# Patient Record
Sex: Female | Born: 1985 | Race: Black or African American | Hispanic: No | Marital: Married | Smoking: Never smoker
Health system: Southern US, Community
[De-identification: ages and names within clinical notes are randomized; demographics above are authoritative.]

## PROBLEM LIST (undated history)

## (undated) ENCOUNTER — Inpatient Hospital Stay (HOSPITAL_COMMUNITY): Payer: Self-pay

## (undated) DIAGNOSIS — J45909 Unspecified asthma, uncomplicated: Secondary | ICD-10-CM

## (undated) HISTORY — DX: Unspecified asthma, uncomplicated: J45.909

## (undated) HISTORY — PX: WRIST SURGERY: SHX841

---

## 2015-10-27 ENCOUNTER — Inpatient Hospital Stay (HOSPITAL_COMMUNITY)
Admission: AD | Admit: 2015-10-27 | Discharge: 2015-10-27 | Disposition: A | Discharge status: Home / Self Care | Payer: Self-pay | Source: Ambulatory Visit | Attending: Obstetrics & Gynecology | Admitting: Obstetrics & Gynecology

## 2015-10-27 DIAGNOSIS — Z3202 Encounter for pregnancy test, result negative: Secondary | ICD-10-CM | POA: Insufficient documentation

## 2015-10-27 DIAGNOSIS — Z789 Other specified health status: Secondary | ICD-10-CM

## 2015-10-27 LAB — POCT PREGNANCY, URINE: PREG TEST UR: NEGATIVE

## 2015-10-27 NOTE — MAU Note (Signed)
Trying to get pregnant but is not happening. Period started 2 days ago but that period started late. Wants to know if she is able to have a baby. No pain tonight

## 2015-10-27 NOTE — Discharge Instructions (Signed)
[facility]   Francoise CeoBernard Marshall      Phone: [phone]  [facility]     Phone: [phone]  [facility] [place] [facility]  Phone: [phone]  [facility] [place]  Phone: [phone]  [facility]    Phone: [phone]   [facility]    Phone: [phone]  [facility]    Phone: [phone]  [facility]    Phone: [phone]  [facility]    Phone: [phone]  [facility] Phone: [phone]   [facility]  Phone: [phone]  [facility]    Phone: [phone]  [facility]   Phone: [phone]  [facility]      Phone: [phone]  [facility]    Phone: [phone]  [facility]     Phone: [phone]

## 2015-10-27 NOTE — MAU Note (Signed)
Tyson DenseMelanie Bhrambi CNM in Triage to talk with pt

## 2015-10-27 NOTE — MAU Provider Note (Signed)
S: Desires pregnancy, attempting conception x1 yr, wants to know why she can't get pregnant. No c/o. LMP 2 days ago.  O: GENERAL: Well-developed, well-nourished female in no acute distress.  LUNGS: Effort normal SKIN: Warm, dry and without erythema PSYCH: Normal mood and affect Results for orders placed or performed during the hospital encounter of 10/27/15 (from the past 24 hour(s))  Pregnancy, urine POC     Status: None   Collection Time: 10/27/15  8:23 PM  Result Value Ref Range   Preg Test, Ur NEGATIVE NEGATIVE     A/P: Attempting conception Ob/Gyn Provider list provided Follow up with provider of choice

## 2015-11-11 ENCOUNTER — Emergency Department (HOSPITAL_COMMUNITY)
Admission: EM | Admit: 2015-11-11 | Discharge: 2015-11-11 | Disposition: A | Discharge status: Home / Self Care | Payer: Self-pay | Attending: Emergency Medicine | Admitting: Emergency Medicine

## 2015-11-11 ENCOUNTER — Encounter (HOSPITAL_COMMUNITY): Payer: Self-pay | Admitting: Emergency Medicine

## 2015-11-11 DIAGNOSIS — N72 Inflammatory disease of cervix uteri: Secondary | ICD-10-CM | POA: Insufficient documentation

## 2015-11-11 LAB — URINALYSIS, ROUTINE W REFLEX MICROSCOPIC
Bilirubin Urine: NEGATIVE
GLUCOSE, UA: NEGATIVE mg/dL
HGB URINE DIPSTICK: NEGATIVE
Ketones, ur: 15 mg/dL — AB
Nitrite: NEGATIVE
PH: 7 (ref 5.0–8.0)
PROTEIN: NEGATIVE mg/dL
SPECIFIC GRAVITY, URINE: 1.018 (ref 1.005–1.030)

## 2015-11-11 LAB — WET PREP, GENITAL
CLUE CELLS WET PREP: NONE SEEN
SPERM: NONE SEEN
TRICH WET PREP: NONE SEEN
Yeast Wet Prep HPF POC: NONE SEEN

## 2015-11-11 LAB — URINE MICROSCOPIC-ADD ON: RBC / HPF: NONE SEEN RBC/hpf (ref 0–5)

## 2015-11-11 LAB — PREGNANCY, URINE: Preg Test, Ur: NEGATIVE

## 2015-11-11 MED ORDER — LIDOCAINE HCL (PF) 1 % IJ SOLN
0.9000 mL | Freq: Once | INTRAMUSCULAR | Status: AC
  Administered 2015-11-11: 0.9 mL
  Filled 2015-11-11: qty 5

## 2015-11-11 MED ORDER — AZITHROMYCIN 250 MG PO TABS
1000.0000 mg | ORAL_TABLET | Freq: Once | ORAL | Status: AC
  Administered 2015-11-11: 1000 mg via ORAL
  Filled 2015-11-11: qty 4

## 2015-11-11 MED ORDER — CEFTRIAXONE SODIUM 250 MG IJ SOLR
250.0000 mg | Freq: Once | INTRAMUSCULAR | Status: AC
Start: 2015-11-11 — End: 2015-11-11
  Administered 2015-11-11: 250 mg via INTRAMUSCULAR
  Filled 2015-11-11: qty 250

## 2015-11-11 NOTE — ED Notes (Signed)
Husband/translator stated, vaginal discharge for one month to 3 weeks , pain in lower abdomen when she has sex.

## 2015-11-11 NOTE — Discharge Instructions (Signed)
Return to the ED with any concerns including vomiting, abdominal pain, fever/chills, fainting, decreased level of alertness/lethargy, or any other alarming symptoms

## 2015-11-11 NOTE — ED Provider Notes (Signed)
CSN: [id number]     Arrival date & time 11/11/15  1431 History   None    Chief Complaint  Patient presents with  . Vaginal Discharge     (Consider location/radiation/quality/duration/timing/severity/associated sxs/prior Treatment) HPI  Pt presenting with c/o vaginal discharge and pelvic pain during sex.  She states she has had symptoms for approx 3 weeks.  No vaginal bleeding.  No abdominal pain.  No pelvic pain except during sex.  No fever.  No vomiting.  She has not had any treatment prior to arrival.  LMP was 2 weeks ago.  No dysuria, no increased frequency/urgency.  There are no other associated systemic symptoms, there are no other alleviating or modifying factors.   History reviewed. No pertinent past medical history. History reviewed. No pertinent past surgical history. No family history on file. Social History  Substance Use Topics  . Smoking status: None  . Smokeless tobacco: None  . Alcohol Use: None   OB History    No data available     Review of Systems  ROS reviewed and all otherwise negative except for mentioned in HPI    Allergies  Review of patient's allergies indicates not on file.  Home Medications   Prior to Admission medications   Not on File   BP 108/71 mmHg  Pulse 65  Temp(Src) 97.9 F (36.6 C) (Oral)  Resp 16  SpO2 99%  LMP 10/25/2015  Vitals reviewed Physical Exam  Physical Examination: General appearance - alert, well appearing, and in no distress Mental status - alert, oriented to person, place, and time Eyes - no conjunctival injection no scleral icterus Chest - clear to auscultation, no wheezes, rales or rhonchi, symmetric air entry Heart - normal rate, regular rhythm, normal S1, S2, no murmurs, rubs, clicks or gallops Abdomen - soft, nontender, nondistended, no masses or organomegaly Pelvic - normal external genitalia, vulva, vagina, cervix, uterus and adnexa, no CMT, no adnexa, scant vaginal discharge Neurological - alert, oriented,  normal speech Extremities - peripheral pulses normal, no pedal edema, no clubbing or cyanosis Skin - normal coloration and turgor, no rashes  ED Course  Procedures (including critical care time) Labs Review Labs Reviewed  WET PREP, GENITAL - Abnormal; Notable for the following:    WBC, Wet Prep HPF POC MANY (*)    All other components within normal limits  URINALYSIS, ROUTINE W REFLEX MICROSCOPIC (NOT AT [facility]) - Abnormal; Notable for the following:    Ketones, ur 15 (*)    Leukocytes, UA TRACE (*)    All other components within normal limits  URINE MICROSCOPIC-ADD ON - Abnormal; Notable for the following:    Squamous Epithelial / LPF 6-30 (*)    Bacteria, UA FEW (*)    All other components within normal limits  PREGNANCY, URINE  GC/CHLAMYDIA PROBE AMP ([place]) NOT AT [facility]    Imaging Review No results found. I have personally reviewed and evaluated these images and lab results as part of my medical decision-making.   EKG Interpretation None      MDM   Final diagnoses:  Cervicitis    Pt presenting with c/o vaginal discharge, dyspareunia.  On exam she has no abdominal tenderness, no CMT or signs of PID.  Her wet prep did show many WBCs, pt treated for cervicitis, genprobe is pending.  Given referral info for gyn as well as [facility].  Discharged with strict return precautions.  Pt agreeable with plan.    Jerelyn Scott, MD 11/11/15  2248 

## 2015-11-13 LAB — GC/CHLAMYDIA PROBE AMP (~~LOC~~) NOT AT ARMC
Chlamydia: NEGATIVE
Neisseria Gonorrhea: NEGATIVE

## 2016-03-03 ENCOUNTER — Emergency Department (HOSPITAL_COMMUNITY)
Admission: EM | Admit: 2016-03-03 | Discharge: 2016-03-03 | Disposition: A | Discharge status: Home / Self Care | Payer: Self-pay | Attending: Emergency Medicine | Admitting: Emergency Medicine

## 2016-03-03 ENCOUNTER — Emergency Department (HOSPITAL_COMMUNITY): Payer: Self-pay

## 2016-03-03 ENCOUNTER — Encounter (HOSPITAL_COMMUNITY): Payer: Self-pay

## 2016-03-03 DIAGNOSIS — R102 Pelvic and perineal pain: Secondary | ICD-10-CM | POA: Insufficient documentation

## 2016-03-03 LAB — CBC
HCT: 41.3 % (ref 36.0–46.0)
HEMOGLOBIN: 13.9 g/dL (ref 12.0–15.0)
MCH: 29.2 pg (ref 26.0–34.0)
MCHC: 33.7 g/dL (ref 30.0–36.0)
MCV: 86.8 fL (ref 78.0–100.0)
Platelets: 331 10*3/uL (ref 150–400)
RBC: 4.76 MIL/uL (ref 3.87–5.11)
RDW: 12.8 % (ref 11.5–15.5)
WBC: 6.8 10*3/uL (ref 4.0–10.5)

## 2016-03-03 LAB — COMPREHENSIVE METABOLIC PANEL
ALK PHOS: 69 U/L (ref 38–126)
ALT: 10 U/L — ABNORMAL LOW (ref 14–54)
ANION GAP: 10 (ref 5–15)
AST: 20 U/L (ref 15–41)
Albumin: 4.4 g/dL (ref 3.5–5.0)
BUN: 9 mg/dL (ref 6–20)
CALCIUM: 9.5 mg/dL (ref 8.9–10.3)
CHLORIDE: 108 mmol/L (ref 101–111)
CO2: 19 mmol/L — AB (ref 22–32)
Creatinine, Ser: 0.67 mg/dL (ref 0.44–1.00)
GFR calc non Af Amer: >60 mL/min (ref >60)
Glucose, Bld: 75 mg/dL (ref 65–99)
Potassium: 3.9 mmol/L (ref 3.5–5.1)
SODIUM: 137 mmol/L (ref 135–145)
Total Bilirubin: 0.7 mg/dL (ref 0.3–1.2)
Total Protein: 7.9 g/dL (ref 6.5–8.1)

## 2016-03-03 LAB — LIPASE, BLOOD: LIPASE: 27 U/L (ref 11–51)

## 2016-03-03 LAB — URINALYSIS, ROUTINE W REFLEX MICROSCOPIC
Bilirubin Urine: NEGATIVE
Glucose, UA: NEGATIVE mg/dL
HGB URINE DIPSTICK: NEGATIVE
Ketones, ur: 15 mg/dL — AB
Leukocytes, UA: NEGATIVE
Nitrite: NEGATIVE
Protein, ur: NEGATIVE mg/dL
SPECIFIC GRAVITY, URINE: 1.013 (ref 1.005–1.030)
pH: 5 (ref 5.0–8.0)

## 2016-03-03 LAB — WET PREP, GENITAL
CLUE CELLS WET PREP: NONE SEEN
Sperm: NONE SEEN
TRICH WET PREP: NONE SEEN
YEAST WET PREP: NONE SEEN

## 2016-03-03 LAB — I-STAT BETA HCG BLOOD, ED (MC, WL, AP ONLY)

## 2016-03-03 MED ORDER — IBUPROFEN 600 MG PO TABS: 600.0000 mg | ORAL_TABLET | Freq: Four times a day (QID) | ORAL | 0 refills | Status: DC | PRN

## 2016-03-03 NOTE — ED Provider Notes (Signed)
[facility]PT Provider Note   CSN: [id number] Arrival date & time: 03/03/16  1037     History   Chief Complaint Chief Complaint  Patient presents with  . Abdominal Pain    HPI Sheila [name] is a 30 y.o. female.  HPI   G1P1001 presents with left lower abdominal pain that began more than a month ago.   The pain is constant but waxes and wanes, is exacerbated by walking, palpation, sexual intercourse.  Has occasional lightheadedness.  Small amount of vaginal discharge.  Has regular periods, LMP 2 weeks ago though she is trying to get pregnant.   Denies fevers, chills, N/V, urinary symptoms, bowel changes.  Has BM every 4 days at baseline, unsure of last BM. Denies feeling constipated.    Language interpretation by patient's significant other.   History reviewed. No pertinent past medical history.  There are no active problems to display for this patient.   History reviewed. No pertinent surgical history.  OB History    No data available       Home Medications    Prior to Admission medications   Medication Sig Start Date End Date Taking? Authorizing Provider  Prenatal Vit-Fe Fumarate-FA (PRENATAL MULTIVITAMIN) TABS tablet Take 1 tablet by mouth daily at 12 noon.   Yes Historical Provider, MD  ibuprofen (ADVIL,MOTRIN) 600 MG tablet Take 1 tablet (600 mg total) by mouth every 6 (six) hours as needed for mild pain or moderate pain. 03/03/16   Trixie Dredge, PA-C    Family History No family history on file.  Social History Social History  Substance Use Topics  . Smoking status: Not on file  . Smokeless tobacco: Not on file  . Alcohol use Not on file     Allergies   Patient has no known allergies.   Review of Systems Review of Systems  All other systems reviewed and are negative.    Physical Exam Updated Vital Signs BP 94/69   Pulse 75   Temp 97.5 F (36.4 C) (Oral)   Resp 16   SpO2 99%   Physical Exam  Constitutional: She appears  well-developed and well-nourished. No distress.  HENT:  Head: Normocephalic and atraumatic.  Neck: Neck supple.  Cardiovascular: Normal rate and regular rhythm.   Pulmonary/Chest: Effort normal and breath sounds normal. No respiratory distress. She has no wheezes. She has no rales.  Abdominal: Soft. She exhibits no distension. There is tenderness in the left lower quadrant. There is no rebound and no guarding.    Genitourinary: Uterus is not tender. Cervix exhibits no motion tenderness. Left adnexum displays tenderness.  Neurological: She is alert.  Skin: She is not diaphoretic.  Nursing note and vitals reviewed.    ED Treatments / Results  Labs (all labs ordered are listed, but only abnormal results are displayed) Labs Reviewed  WET PREP, GENITAL - Abnormal; Notable for the following:       Result Value   WBC, Wet Prep HPF POC FEW (*)    All other components within normal limits  COMPREHENSIVE METABOLIC PANEL - Abnormal; Notable for the following:    CO2 19 (*)    ALT 10 (*)    All other components within normal limits  URINALYSIS, ROUTINE W REFLEX MICROSCOPIC (NOT AT [facility]) - Abnormal; Notable for the following:    Ketones, ur 15 (*)    All other components within normal limits  LIPASE, BLOOD  CBC  RPR  HIV ANTIBODY (ROUTINE TESTING)  I-STAT BETA HCG BLOOD,  ED ([facility], [facility], [facility] ONLY)  GC/CHLAMYDIA PROBE AMP ([place]) NOT AT [facility]    EKG  EKG Interpretation None       Radiology [place] Transvaginal Non-ob  Result Date: 03/03/2016 CLINICAL DATA:  Pelvic pain on the left for 5 months. EXAM: TRANSABDOMINAL AND TRANSVAGINAL ULTRASOUND OF PELVIS DOPPLER ULTRASOUND OF OVARIES TECHNIQUE: Both transabdominal and transvaginal ultrasound examinations of the pelvis were performed. Transabdominal technique was performed for global imaging of the pelvis including uterus, ovaries, adnexal regions, and pelvic cul-de-sac. It was necessary to proceed with endovaginal exam following the  transabdominal exam to visualize the endometrium and ovaries. Color and duplex Doppler ultrasound was utilized to evaluate blood flow to the ovaries. COMPARISON:  None. FINDINGS: Uterus Measurements: 7.4 x 3.5 x 5.1 cm. No fibroids or other mass visualized. Endometrium Thickness: 9 mm.  No focal abnormality visualized. Right ovary Measurements: 3.8 x 2.3 x 2.3 cm. Probable hemorrhagic cyst in the right ovary measuring 2.1 cm. A 6 week follow-up ultrasound could ensure resolution. Left ovary Measurements: 2.3 x 1.5 x 2.1 cm. Normal appearance/no adnexal mass. Pulsed Doppler evaluation of both ovaries demonstrates normal low-resistance arterial and venous waveforms. Other findings A small amount of physiologic fluid is seen in the pelvis. IMPRESSION: 1. No cause for left-sided pelvic pain. 2. Rounded complex region in the right ovary, likely a hemorrhagic cyst. A 6 week follow-up could ensure resolution. Electronically Signed   By: Gerome Samavid  Williams III M.D   On: 03/03/2016 13:06   [place] Pelvis Complete  Result Date: 03/03/2016 CLINICAL DATA:  Pelvic pain on the left for 5 months. EXAM: TRANSABDOMINAL AND TRANSVAGINAL ULTRASOUND OF PELVIS DOPPLER ULTRASOUND OF OVARIES TECHNIQUE: Both transabdominal and transvaginal ultrasound examinations of the pelvis were performed. Transabdominal technique was performed for global imaging of the pelvis including uterus, ovaries, adnexal regions, and pelvic cul-de-sac. It was necessary to proceed with endovaginal exam following the transabdominal exam to visualize the endometrium and ovaries. Color and duplex Doppler ultrasound was utilized to evaluate blood flow to the ovaries. COMPARISON:  None. FINDINGS: Uterus Measurements: 7.4 x 3.5 x 5.1 cm. No fibroids or other mass visualized. Endometrium Thickness: 9 mm.  No focal abnormality visualized. Right ovary Measurements: 3.8 x 2.3 x 2.3 cm. Probable hemorrhagic cyst in the right ovary measuring 2.1 cm. A 6 week follow-up ultrasound  could ensure resolution. Left ovary Measurements: 2.3 x 1.5 x 2.1 cm. Normal appearance/no adnexal mass. Pulsed Doppler evaluation of both ovaries demonstrates normal low-resistance arterial and venous waveforms. Other findings A small amount of physiologic fluid is seen in the pelvis. IMPRESSION: 1. No cause for left-sided pelvic pain. 2. Rounded complex region in the right ovary, likely a hemorrhagic cyst. A 6 week follow-up could ensure resolution. Electronically Signed   By: Gerome Samavid  Williams III M.D   On: 03/03/2016 13:06   [place] Art/ven Flow Abd Pelv Doppler  Result Date: 03/03/2016 CLINICAL DATA:  Pelvic pain on the left for 5 months. EXAM: TRANSABDOMINAL AND TRANSVAGINAL ULTRASOUND OF PELVIS DOPPLER ULTRASOUND OF OVARIES TECHNIQUE: Both transabdominal and transvaginal ultrasound examinations of the pelvis were performed. Transabdominal technique was performed for global imaging of the pelvis including uterus, ovaries, adnexal regions, and pelvic cul-de-sac. It was necessary to proceed with endovaginal exam following the transabdominal exam to visualize the endometrium and ovaries. Color and duplex Doppler ultrasound was utilized to evaluate blood flow to the ovaries. COMPARISON:  None. FINDINGS: Uterus Measurements: 7.4 x 3.5 x 5.1 cm. No fibroids or other mass visualized. Endometrium  Thickness: 9 mm.  No focal abnormality visualized. Right ovary Measurements: 3.8 x 2.3 x 2.3 cm. Probable hemorrhagic cyst in the right ovary measuring 2.1 cm. A 6 week follow-up ultrasound could ensure resolution. Left ovary Measurements: 2.3 x 1.5 x 2.1 cm. Normal appearance/no adnexal mass. Pulsed Doppler evaluation of both ovaries demonstrates normal low-resistance arterial and venous waveforms. Other findings A small amount of physiologic fluid is seen in the pelvis. IMPRESSION: 1. No cause for left-sided pelvic pain. 2. Rounded complex region in the right ovary, likely a hemorrhagic cyst. A 6 week follow-up could ensure  resolution. Electronically Signed   By: Gerome Samavid  Williams III M.D   On: 03/03/2016 13:06   Ct Renal Stone Study  Result Date: 03/03/2016 CLINICAL DATA:  Left lower quadrant pain for 5 days. EXAM: CT ABDOMEN AND PELVIS WITHOUT CONTRAST TECHNIQUE: Multidetector CT imaging of the abdomen and pelvis was performed following the standard protocol without IV contrast. COMPARISON:  None. FINDINGS: Lower chest:  Unremarkable. Hepatobiliary: No focal abnormality in the liver on this study without intravenous contrast. There is no evidence for gallstones, gallbladder wall thickening, or pericholecystic fluid. No intrahepatic or extrahepatic biliary dilation. Pancreas: No focal mass lesion. No dilatation of the main duct. No intraparenchymal cyst. No peripancreatic edema. Spleen: No splenomegaly. No focal mass lesion. Adrenals/Urinary Tract: No adrenal nodule or mass. Kidneys are unremarkable with no evidence for nephrolithiasis. No evidence for hydroureter. The urinary bladder appears normal for the degree of distention. Stomach/Bowel: Stomach is nondistended. No gastric wall thickening. No evidence of outlet obstruction. Duodenum is normally positioned as is the ligament of Treitz. The terminal ileum is normal. The appendix is normal. No gross colonic mass. No colonic wall thickening. No substantial diverticular change. Vascular/Lymphatic: No abdominal aortic aneurysm. No abdominal aortic atherosclerotic calcification. There is no gastrohepatic or hepatoduodenal ligament lymphadenopathy. No intraperitoneal or retroperitoneal lymphadenopathy. No pelvic sidewall lymphadenopathy. Reproductive: The uterus has normal CT imaging appearance. There is no adnexal mass. Other: Trace intraperitoneal free fluid noted in the cul-de-sac. Musculoskeletal: Bone windows reveal no worrisome lytic or sclerotic osseous lesions. IMPRESSION: 1. No acute findings in the abdomen or pelvis. Specifically, no findings to explain the patient's  history of left lower quadrant pain. 2. Trace free fluid in the cul-de-sac. This can be physiologic in a reproductive age female. Electronically Signed   By: Kennith CenterEric  Mansell M.D.   On: 03/03/2016 16:03    Procedures Procedures (including critical care time)  Medications Ordered in ED Medications - No data to display   Initial Impression / Assessment and Plan / ED Course  I have reviewed the triage vital signs and the nursing notes.  Pertinent labs & imaging results that were available during my care of the patient were reviewed by me and considered in my medical decision making (see chart for details).  Clinical Course     Afebrile nontoxic patient with left pelvic pain x >1 month.  Labs unremarkable.  UA does not appear infected.  Pelvic exam with left adnexal tenderness.  Wet prep unremarkable.   US pelvis with right hemorrhagic cyst but nothing on left.  CT without acute findings.  Unclear why pt has this pain.  Referred to gynecology.  D/C home with motrin.  Discussed result, findings, treatment, and follow up  with patient.  Pt given return precautions.  Pt verbalizes understanding and agrees with plan.       Final Clinical Impressions(s) / ED Diagnoses   Final diagnoses:  Pelvic pain  Pelvic  pain    New Prescriptions Discharge Medication List as of 03/03/2016  4:29 PM    START taking these medications   Details  ibuprofen (ADVIL,MOTRIN) 600 MG tablet Take 1 tablet (600 mg total) by mouth every 6 (six) hours as needed for mild pain or moderate pain., Starting Sun 03/03/2016, Print         [place], PA-C 03/03/16 1711    Nelva Nay, MD 03/20/16 [phone]

## 2016-03-03 NOTE — ED Triage Notes (Signed)
Patient complains of left sided abdominal pain for greater than 1 month. States hurts almost daily and worse with ambulation. No nausea, no vomiting, no diarrhea. Alert and oriented, NAD

## 2016-03-03 NOTE — Discharge Instructions (Signed)
Read the information below.  You may return to the Emergency Department at any time for worsening condition or any new symptoms that concern you.  If you develop high fevers, worsening abdominal pain, uncontrolled vomiting, or are unable to tolerate fluids by mouth, return to the ER for a recheck.   °

## 2016-03-04 LAB — RPR: RPR Ser Ql: NONREACTIVE

## 2016-03-04 LAB — GC/CHLAMYDIA PROBE AMP (~~LOC~~) NOT AT ARMC
CHLAMYDIA, DNA PROBE: NEGATIVE
Neisseria Gonorrhea: NEGATIVE

## 2016-03-04 LAB — HIV ANTIBODY (ROUTINE TESTING W REFLEX): HIV Screen 4th Generation wRfx: NONREACTIVE

## 2016-03-25 ENCOUNTER — Ambulatory Visit (INDEPENDENT_AMBULATORY_CARE_PROVIDER_SITE_OTHER): Payer: Self-pay | Admitting: Obstetrics & Gynecology

## 2016-03-25 ENCOUNTER — Ambulatory Visit (INDEPENDENT_AMBULATORY_CARE_PROVIDER_SITE_OTHER): Payer: Self-pay | Admitting: Clinical

## 2016-03-25 ENCOUNTER — Encounter: Payer: Self-pay | Admitting: Obstetrics & Gynecology

## 2016-03-25 DIAGNOSIS — R109 Unspecified abdominal pain: Secondary | ICD-10-CM

## 2016-03-25 DIAGNOSIS — R102 Pelvic and perineal pain: Secondary | ICD-10-CM | POA: Insufficient documentation

## 2016-03-25 DIAGNOSIS — F4322 Adjustment disorder with anxiety: Secondary | ICD-10-CM

## 2016-03-25 MED ORDER — POLYETHYLENE GLYCOL 3350 17 G PO PACK: 17.0000 g | PACK | Freq: Every day | ORAL | 1 refills | Status: DC

## 2016-03-25 NOTE — Progress Notes (Signed)
Used [employer] video interpreter Nadege [id number]. States ibuprofen help pain, when it wears off pain =9.

## 2016-03-25 NOTE — BH Specialist Note (Signed)
Session Start time: 10:30   End Time: 10:50 Total Time:  20 minutes Type of Service: Behavioral Health - Individual/Family Interpreter: Yes.     Interpreter Name & Language: Janice NorrieFrench # [facility]-June [facility]   SUBJECTIVE: Sheila [name] is a 30 y.o. female  Pt. was referred by Dr Debroah LoopArnold for:  anxiety. Pt. reports the following symptoms/concerns: Pt states that she has been feeling anxious the past month, as she is afraid that she might have cancer, although she has not had a cancer diagnosis; copes by spending time with friends to get her mind off of her worries.Pt says that anxiety is not a problem, and does not affect daily functioning. Duration of problem:  One month Severity: moderate Previous treatment: none   OBJECTIVE: Mood: Appropriate & Affect: Appropriate Risk of harm to self or others: No known risk of harm to self or others Assessments administered: PHQ9: 2/ GAD7: 13  LIFE CONTEXT:  Family & Social: Friends and family close and supportive  School/ Work: Uncertain Self-Care: No issues with self-care Life changes: Undiagnosed abdominal pain What is important to pt/family (values): Unknown   GOALS ADDRESSED:  -Alleviate symptoms of anxiety  INTERVENTIONS: Strength-based and Supportive   ASSESSMENT:  Pt currently experiencing Adjustment disorder with anxious mood.  Pt may benefit from brief therapeutic intervention regarding coping with symptoms of anxiety.    PLAN: 1. F/U with behavioral health clinician: As needed 2. Behavioral Health meds: none 3. Behavioral recommendations: -Continue to spend routine time with friends for emotional support -[facility], if symptoms increase and/or do not improve 4. Referral: Brief Counseling/Psychotherapy  Rae LipsJamie C Mcmannes LCSWA Behavioral Health Clinician  Marlon PelWarmhandoff:   Warm Hand Off Completed.        Depression screen PHQ 2/9 03/25/2016  Decreased Interest 0  Down, Depressed,  Hopeless 0  PHQ - 2 Score 0  Altered sleeping 0  Tired, decreased energy 2  Change in appetite 0  Feeling bad or failure about yourself  0  Trouble concentrating 0  Moving slowly or fidgety/restless 0  Suicidal thoughts 0  PHQ-9 Score 2   GAD 7 : Generalized Anxiety Score 03/25/2016  Nervous, Anxious, on Edge 2  Control/stop worrying 3  Worry too much - different things 3  Trouble relaxing 2  Restless 0  Easily annoyed or irritable 0  Afraid - awful might happen 3  Total GAD 7 Score 13
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
## 2016-04-22 NOTE — L&D Delivery Note (Signed)
Patient is 31 y.o. G2P1001 2420w1d admitted for IOL 2/2 postdates. S/p IOL with foley bulb, cytotec, followed by Pitocin.  Delivery Note At 2:49 AM a viable female was delivered via Vaginal, Spontaneous Delivery (Presentation:cephalic ;LOA  ).  APGAR:8 ,9 ; weight  pending.   Placenta status: intact ,spontaneous .  Cord: 3 vessel   Anesthesia:  epidural Episiotomy: None Lacerations:  none Est. Blood Loss (mL): 250  Mom to postpartum.  Baby to Couplet care / Skin to Skin.    Upon arrival, patient was complete. She pushed with good maternal effort to deliver a viable female infant in cephalic, LOA position over intact perineum. No nuchal cord present. Anterior shoulder delivered easily. Baby was noted to have good tone and place on maternal abdomen for oral suctioning, drying and stimulation. Delayed cord clamping performed. Placenta delivered spontaneously with gentle cord traction. Fundus firm with massage and Pitocin. Perineum inspected and found to have no laceration. Counts of sharps, instruments, and lap pads were all correct.   Rolm BookbinderAmber Tahirih Lair, DO [facility]

## 2016-04-25 ENCOUNTER — Ambulatory Visit (INDEPENDENT_AMBULATORY_CARE_PROVIDER_SITE_OTHER): Payer: Self-pay | Admitting: Family Medicine

## 2016-04-25 ENCOUNTER — Encounter: Payer: Self-pay | Admitting: Family Medicine

## 2016-04-25 VITALS — BP 104/66 | HR 82 | Ht 66.0 in | Wt 144.4 lb

## 2016-04-25 DIAGNOSIS — R102 Pelvic and perineal pain: Secondary | ICD-10-CM

## 2016-04-25 NOTE — Assessment & Plan Note (Signed)
The patient has s/sx's of possible endometriosis due to dysmenorrhea and dyspareunia. Diagnosis could not be made without surgery. Patient without insurance which makes this elective surgery less desirable. Offered trial of OC's. Will repeat her u/s to ensure her previously noted hemorrhagic cyst is resolved.

## 2016-04-25 NOTE — Patient Instructions (Signed)
Douleur pelvienne, Femme La douleur pelvienne est ressentie sous le nombril et [place] Tool Worksentre les hanches. Cela peut tre caus par beaucoup de choses diffrentes. Il est important d'obtenir de l'aide immdiatement. Cela est particulirement vrai pour les douleurs svres, aigus ou inhabituelles qui surviennent soudainement. SOINS  DOMICILE Prescilla Soursrenez seulement des mdicaments comme indiqu par Actorvotre docteur. Reposez-vous comme indiqu par Exelon Corporationvotre docteur. Mangez une alimentation saine, comme les fruits, les lgumes et les The PNC Financialviandes maigres. Buvez suffisamment de liquides pour garder Newell Rubbermaidvotre pipi (urine) clair ou jaune ple, ou comme indiqu. vitez le sexe (rapports sexuels) s'il provoque la douleur. Appliquez des compresses chaudes ou froides sur votre bas-ventre (abdomen). Utilisez le type de pack Comptrollerqui aide la douleur. vitez les situations qui vous causent du stress. Arcola Janskyenez un journal pour Abbott Laboratoriessuivre votre douleur. crire: Smithfield FoodsQuand la douleur a commenc. O se trouve-t-il? S'il y a des [address]choses qui semblent tre lies  la douleur, comme la nourriture ou vos rgles. Suivi avec votre mdecin comme dit. OBTENIR DE L'AIDE IMMDIATEMENT SI: Vous avez des saignements Sempra Energyabondants du vagin. Vous avez plus de douleur pelvienne. Vous vous sentez tourdi ou vanoui (vanoui). Vous avez des frissons. Vous avez mal quand vous pissez ou Goodrich Corporationavez du sang dans votre urine. Vous ne pouvez pas arrter d'avoir merde merde (diarrhe). Vous ne pouvez pas arrter de vomir. Vous avez de la fivre ou des symptmes durables depuis plus de 3 jours. Vous avez de la fivre et vos symptmes s'aggravent soudainement. Vous tes abus physiquement ou sexuellement. Votre mdicament n'aide Therapist, occupationalpas votre douleur. Vous avez du liquide (dcharge) venant de votre vagin qui n'est pas normal. ASSUREZ-VOUS: Comprenez ces instructions. Je surveillerai votre tat. Vous obtiendrez de l'aide si vous ne vous portez pas bien ou si vous allez mal. Cette  information n'est pas destine  remplacer les conseils donns par votre fournisseur de soins de sant. Assurez-vous de Lear Corporationdiscuter des questions que vous Optometristavez avec votre fournisseur de soins de sant. Document Diffusion: 09/25/2007 Document rvis: 04/29/2014 Document rvis: 01/27/2015 Elsevier Interactive Patient Education  2017 [employer]vier [employer]

## 2016-04-25 NOTE — Progress Notes (Signed)
   Subjective:    Patient ID: Sheila [name] is a 31 y.o. female presenting with Follow-up  on 04/25/2016  HPI: VideoFrench interpreter: Boykin ReaperRachelle [phone] used Several month h/o LLQ pain. Seen in ED with negative UA and negative wet prep, neg. U/s and neg CT. Pain began with intercourse. Pain is there all the time, if she pushes. She does not feel it with movement. Always painful with intercourse. Always had painful regular cycles. She is G1P1001 First pregnancy at 31 yo. Trying to get pregnant now and is not getting pregnant. Trying x 2 years without results.    Review of Systems  Constitutional: Negative for chills and fever.  Respiratory: Negative for shortness of breath.   Cardiovascular: Negative for chest pain.  Gastrointestinal: Negative for abdominal pain, nausea and vomiting.  Genitourinary: Negative for dysuria.  Skin: Negative for rash.      Objective:    BP 104/66   Pulse 82   Ht 5\' 6"  (1.676 m)   Wt 144 lb 6.4 oz (65.5 kg)   LMP 04/08/2016 (Exact Date)   BMI 23.31 kg/m  Physical Exam  Constitutional: She is oriented to person, place, and time. She appears well-developed and well-nourished. No distress.  HENT:  Head: Normocephalic and atraumatic.  Eyes: No scleral icterus.  Neck: Neck supple.  Cardiovascular: Normal rate.   Pulmonary/Chest: Effort normal.  Abdominal: Soft.    Neurological: She is alert and oriented to person, place, and time.  Skin: Skin is warm and dry.  Psychiatric: She has a normal mood and affect.        Assessment & Plan:   Problem List Items Addressed This Visit      Unprioritized   Pelvic pain - Primary    The patient has s/sx's of possible endometriosis due to dysmenorrhea and dyspareunia. Diagnosis could not be made without surgery. Patient without insurance which makes this elective surgery less desirable. Offered trial of OC's. Will repeat her u/s to ensure her previously noted hemorrhagic cyst is resolved.       Relevant Orders   US Pelvis Complete   US Transvaginal Non-OB       Total face-to-face time with patient: 25 minutes. Over 50% of encounter was spent on counseling and coordination of care. Return in about 3 months (around 07/24/2016).  Reva Boresanya S Tavaughn Silguero 04/25/2016 1:17 PM

## 2016-04-25 NOTE — Progress Notes (Signed)
Pt reports that she is still having pain, was told to followup if she was still having problems.

## 2016-04-26 NOTE — Progress Notes (Signed)
This encounter was created in error - please disregard.

## 2016-05-02 ENCOUNTER — Ambulatory Visit (HOSPITAL_COMMUNITY)
Admission: RE | Admit: 2016-05-02 | Discharge: 2016-05-02 | Disposition: A | Discharge status: Home / Self Care | Payer: Self-pay | Source: Ambulatory Visit | Attending: Family Medicine | Admitting: Family Medicine

## 2016-05-02 DIAGNOSIS — R102 Pelvic and perineal pain: Secondary | ICD-10-CM | POA: Insufficient documentation

## 2016-05-02 DIAGNOSIS — R938 Abnormal findings on diagnostic imaging of other specified body structures: Secondary | ICD-10-CM | POA: Insufficient documentation

## 2016-05-06 ENCOUNTER — Telehealth: Payer: Self-pay | Admitting: General Practice

## 2016-05-06 NOTE — Telephone Encounter (Signed)
Per Dr Shawnie PonsPratt, patient's ultrasound is normal. Called patient, no answer- left message stating we are trying to reach you regarding non urgent results, please call us back. Will send letter

## 2016-06-06 ENCOUNTER — Ambulatory Visit: Payer: Self-pay

## 2016-06-06 ENCOUNTER — Encounter: Payer: Self-pay | Admitting: Family Medicine

## 2016-06-06 DIAGNOSIS — Z3201 Encounter for pregnancy test, result positive: Secondary | ICD-10-CM

## 2016-06-06 LAB — POCT PREGNANCY, URINE: Preg Test, Ur: POSITIVE — AB

## 2016-06-18 NOTE — Progress Notes (Signed)
Patient presented to office for a pregnancy test. Test confirms she is pregnant at this time. Patient will follow up with her ob of choice. Medication were reviewed at this time.

## 2016-07-22 ENCOUNTER — Ambulatory Visit (INDEPENDENT_AMBULATORY_CARE_PROVIDER_SITE_OTHER): Payer: Self-pay | Admitting: Family Medicine

## 2016-07-22 ENCOUNTER — Encounter: Payer: Self-pay | Admitting: Family Medicine

## 2016-07-22 VITALS — BP 91/75 | HR 77 | Wt 139.3 lb

## 2016-07-22 DIAGNOSIS — Z1151 Encounter for screening for human papillomavirus (HPV): Secondary | ICD-10-CM

## 2016-07-22 DIAGNOSIS — O219 Vomiting of pregnancy, unspecified: Secondary | ICD-10-CM

## 2016-07-22 DIAGNOSIS — Z3481 Encounter for supervision of other normal pregnancy, first trimester: Secondary | ICD-10-CM

## 2016-07-22 DIAGNOSIS — Z3491 Encounter for supervision of normal pregnancy, unspecified, first trimester: Secondary | ICD-10-CM

## 2016-07-22 DIAGNOSIS — Z3490 Encounter for supervision of normal pregnancy, unspecified, unspecified trimester: Secondary | ICD-10-CM | POA: Insufficient documentation

## 2016-07-22 DIAGNOSIS — Z113 Encounter for screening for infections with a predominantly sexual mode of transmission: Secondary | ICD-10-CM

## 2016-07-22 DIAGNOSIS — Z124 Encounter for screening for malignant neoplasm of cervix: Secondary | ICD-10-CM

## 2016-07-22 LAB — POCT URINALYSIS DIP (DEVICE)
BILIRUBIN URINE: NEGATIVE
Glucose, UA: NEGATIVE mg/dL
HGB URINE DIPSTICK: NEGATIVE
KETONES UR: NEGATIVE mg/dL
LEUKOCYTES UA: NEGATIVE
Nitrite: NEGATIVE
Protein, ur: NEGATIVE mg/dL
SPECIFIC GRAVITY, URINE: 1.02 (ref 1.005–1.030)
Urobilinogen, UA: 2 mg/dL — ABNORMAL HIGH (ref 0.0–1.0)
pH: 7 (ref 5.0–8.0)

## 2016-07-22 MED ORDER — PROCHLORPERAZINE MALEATE 10 MG PO TABS: 10.0000 mg | ORAL_TABLET | Freq: Four times a day (QID) | ORAL | 3 refills | Status: DC | PRN
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
## 2016-07-22 NOTE — Progress Notes (Signed)
Patient reports a pain in her leg/hip making it difficult to move in the mornings and get out of bed

## 2016-07-24 LAB — OBSTETRIC PANEL, INCLUDING HIV
ANTIBODY SCREEN: NEGATIVE
BASOS: 1 %
Basophils Absolute: 0 10*3/uL (ref 0.0–0.2)
EOS (ABSOLUTE): 0.1 10*3/uL (ref 0.0–0.4)
Eos: 2 %
HEMATOCRIT: 37.8 % (ref 34.0–46.6)
HIV SCREEN 4TH GENERATION: NONREACTIVE
Hemoglobin: 12.7 g/dL (ref 11.1–15.9)
Hepatitis B Surface Ag: NEGATIVE
Immature Grans (Abs): 0 10*3/uL (ref 0.0–0.1)
Immature Granulocytes: 0 %
LYMPHS ABS: 2.1 10*3/uL (ref 0.7–3.1)
Lymphs: 38 %
MCH: 29.3 pg (ref 26.6–33.0)
MCHC: 33.6 g/dL (ref 31.5–35.7)
MCV: 87 fL (ref 79–97)
MONOS ABS: 0.3 10*3/uL (ref 0.1–0.9)
Monocytes: 5 %
NEUTROS ABS: 3 10*3/uL (ref 1.4–7.0)
Neutrophils: 54 %
Platelets: 301 10*3/uL (ref 150–379)
RBC: 4.33 x10E6/uL (ref 3.77–5.28)
RDW: 14.3 % (ref 12.3–15.4)
RPR Ser Ql: NONREACTIVE
Rh Factor: POSITIVE
Rubella Antibodies, IGG: 8.13 index (ref >0.99)
WBC: 5.6 10*3/uL (ref 3.4–10.8)

## 2016-07-24 LAB — HEMOGLOBINOPATHY EVALUATION
Ferritin: 119 ng/mL (ref 15–150)
HGB S: 0 %
HGB SOLUBILITY: NEGATIVE
HGB VARIANT: 0 %
Hgb A2 Quant: 2.5 % (ref 1.8–3.2)
Hgb A: 97.5 % (ref 96.4–98.8)
Hgb C: 0 %
Hgb F Quant: 0 % (ref 0.0–2.0)

## 2016-07-24 LAB — CULTURE, OB URINE

## 2016-07-24 LAB — URINE CULTURE, OB REFLEX

## 2016-07-24 LAB — TSH: TSH: 1.32 u[IU]/mL (ref 0.450–4.500)

## 2016-07-25 LAB — CYTOLOGY - PAP
ADEQUACY: ABSENT — AB
Diagnosis: UNDETERMINED — AB
HPV: NOT DETECTED

## 2016-07-27 ENCOUNTER — Encounter: Payer: Self-pay | Admitting: Family Medicine

## 2016-07-27 DIAGNOSIS — R87610 Atypical squamous cells of undetermined significance on cytologic smear of cervix (ASC-US): Secondary | ICD-10-CM

## 2016-07-27 HISTORY — DX: Atypical squamous cells of undetermined significance on cytologic smear of cervix (ASC-US): R87.610

## 2016-08-16 ENCOUNTER — Telehealth: Payer: Self-pay | Admitting: General Practice

## 2016-08-16 ENCOUNTER — Encounter: Payer: Self-pay | Admitting: Obstetrics & Gynecology

## 2016-08-20 ENCOUNTER — Ambulatory Visit (INDEPENDENT_AMBULATORY_CARE_PROVIDER_SITE_OTHER): Payer: Self-pay | Admitting: Obstetrics and Gynecology

## 2016-08-20 VITALS — BP 115/59 | HR 81 | Wt 143.2 lb

## 2016-08-20 DIAGNOSIS — Z3491 Encounter for supervision of normal pregnancy, unspecified, first trimester: Secondary | ICD-10-CM

## 2016-08-20 DIAGNOSIS — Z113 Encounter for screening for infections with a predominantly sexual mode of transmission: Secondary | ICD-10-CM

## 2016-08-20 NOTE — Progress Notes (Signed)
Patient declines video interpreter.

## 2016-08-20 NOTE — Patient Instructions (Signed)

## 2016-08-21 LAB — GC/CHLAMYDIA PROBE AMP (~~LOC~~) NOT AT ARMC
CHLAMYDIA, DNA PROBE: NEGATIVE
Neisseria Gonorrhea: NEGATIVE

## 2016-08-22 NOTE — Progress Notes (Signed)
   PRENATAL VISIT NOTE  Subjective:  Hurshel PartyFatouma Mounkaila is a 31 y.o. G2P1001 at 173w3d being seen today for ongoing prenatal care.  She is currently monitored for the following issues for this low-risk pregnancy and has Pelvic pain; Supervision of low-risk pregnancy; and Atypical squamous cell changes of undetermined significance (ASCUS) on cervical cytology with negative high risk human papilloma virus (HPV) test result on her problem list.  Patient reports no complaints.  Contractions: Not present. Vag. Bleeding: None.  Movement: Absent. Denies leaking of fluid.   The following portions of the patient's history were reviewed and updated as appropriate: allergies, current medications, past family history, past medical history, past social history, past surgical history and problem list. Problem list updated.  Objective:   Vitals:   08/20/16 1608  BP: (!) 115/59  Pulse: 81  Weight: 143 lb 3.2 oz (65 kg)    Fetal Status: Fetal Heart Rate (bpm): 156   Movement: Absent     General:  Alert, oriented and cooperative. Patient is in no acute distress.  Skin: Skin is warm and dry. No rash noted.   Cardiovascular: Normal heart rate noted  Respiratory: Normal respiratory effort, no problems with respiration noted  Abdomen: Soft, gravid, appropriate for gestational age. Pain/Pressure: Absent     Pelvic:  Cervical exam deferred        Extremities: Normal range of motion.  Edema: None  Mental Status: Normal mood and affect. Normal behavior. Normal judgment and thought content.   Assessment and Plan:  Pregnancy: G2P1001 at 783w3d  1. Encounter for supervision of low-risk pregnancy in first trimester Up to date on screening except GC/CT - GC[facility]Chlamydia probe amp ()[facility]  Preterm labor symptoms and general obstetric precautions including but not limited to vaginal bleeding, contractions, leaking of fluid and fetal movement were reviewed in detail with the patient. Please refer to  After Visit Summary for other counseling recommendations.  Return in about 4 weeks (around 09/17/2016) for LOB.   Lorne SkeensNicholas Michael Quintasia Theroux, MD

## 2016-09-19 ENCOUNTER — Ambulatory Visit (INDEPENDENT_AMBULATORY_CARE_PROVIDER_SITE_OTHER): Payer: Self-pay | Admitting: Obstetrics and Gynecology

## 2016-09-19 VITALS — BP 108/56 | HR 70 | Wt 145.1 lb

## 2016-09-19 DIAGNOSIS — Z3492 Encounter for supervision of normal pregnancy, unspecified, second trimester: Secondary | ICD-10-CM

## 2016-09-19 NOTE — Progress Notes (Signed)
   PRENATAL VISIT NOTE  Subjective:  Sheila [name] is a 31 y.o. G2P1001 at 5079w3d being seen today for ongoing prenatal care.  She is currently monitored for the following issues for this low-risk pregnancy and has Pelvic pain; Supervision of low-risk pregnancy; and Atypical squamous cell changes of undetermined significance (ASCUS) on cervical cytology with negative high risk human papilloma virus (HPV) test result on her problem list.  Patient reports leg cramps.  Contractions: Not present. Vag. Bleeding: None.  Movement: Absent. Denies leaking of fluid.   The following portions of the patient's history were reviewed and updated as appropriate: allergies, current medications, past family history, past medical history, past social history, past surgical history and problem list. Problem list updated.  Objective:   Vitals:   09/19/16 0954  BP: (!) 108/56  Pulse: 70  Weight: 65.8 kg (145 lb 1.6 oz)    Fetal Status:     Movement: Absent     General:  Alert, oriented and cooperative. Patient is in no acute distress.  Skin: Skin is warm and dry. No rash noted.   Cardiovascular: Normal heart rate noted  Respiratory: Normal respiratory effort, no problems with respiration noted  Abdomen: Soft, gravid, appropriate for gestational age. Pain/Pressure: Present     Pelvic:  Cervical exam deferred        Extremities: Normal range of motion.  Edema: None  Mental Status: Normal mood and affect. Normal behavior. Normal judgment and thought content.   Assessment and Plan:  Pregnancy: G2P1001 at 2279w3d  1. Encounter for supervision of low-risk pregnancy in second trimester Doing well Pt is [employer] [facility]  Preterm labor symptoms and general obstetric precautions including but not limited to vaginal bleeding, contractions, leaking of fluid and fetal movement were reviewed in detail with the patient. Leg cramp relief measures discussed. Start taking the PNV  every days.  Please refer to After Visit Summary for other counseling recommendations.  Return in about 4 weeks (around 10/17/2016).   Caren Griffinseirdre Coby Antrobus, CNM

## 2016-09-19 NOTE — Patient Instructions (Signed)

## 2016-09-19 NOTE — Progress Notes (Signed)
Patient complains of cramps in the legs

## 2016-09-26 ENCOUNTER — Ambulatory Visit (HOSPITAL_COMMUNITY): Payer: Self-pay

## 2016-10-18 ENCOUNTER — Ambulatory Visit (INDEPENDENT_AMBULATORY_CARE_PROVIDER_SITE_OTHER): Payer: Self-pay | Admitting: Obstetrics & Gynecology

## 2016-10-18 VITALS — BP 102/72 | HR 85 | Wt 151.0 lb

## 2016-10-18 DIAGNOSIS — Z3492 Encounter for supervision of normal pregnancy, unspecified, second trimester: Secondary | ICD-10-CM

## 2016-10-18 NOTE — Progress Notes (Signed)
Breastfeeding tip reviewed 

## 2016-10-18 NOTE — Patient Instructions (Addendum)
[facility]  Tdap Vaccine (Tetanus, Diphtheria and Pertussis): What You Need to Know 1. Why get vaccinated? Tetanus, diphtheria and pertussis are very serious diseases. Tdap vaccine can protect [place] from these diseases. And, Tdap vaccine given to pregnant women can protect newborn babies against pertussis. TETANUS (Lockjaw) is rare in the [place] today. It causes painful muscle tightening and stiffness, usually all over the body.  It can lead to tightening of muscles in the head and neck so you can't open your mouth, swallow, or sometimes even breathe. Tetanus kills about 1 out of 10 people who are infected even after receiving the best medical care.  DIPHTHERIA is also rare in the [place] today. It can cause a thick coating to form in the back of the throat.  It can lead to breathing problems, heart failure, paralysis, and death.  PERTUSSIS (Whooping Cough) causes severe coughing spells, which can cause difficulty breathing, vomiting and disturbed sleep.  It can also lead to weight loss, incontinence, and rib fractures. Up to 2 in 100 adolescents and 5 in 100 adults with pertussis are hospitalized or have complications, which could include pneumonia or death.  These diseases are caused by bacteria. Diphtheria and pertussis are spread from person to person through secretions from coughing or sneezing. Tetanus enters the body through cuts, scratches, or wounds. Before vaccines, as many as 200,000 cases of diphtheria, 200,000 cases of pertussis, and hundreds of cases of tetanus, were reported in the [place] each year. Since vaccination began, reports of cases for tetanus and diphtheria have dropped by about 99% and for pertussis by about 80%. 2. Tdap vaccine Tdap vaccine can protect adolescents and adults from tetanus, diphtheria, and pertussis. One dose of Tdap is routinely given at age 82 or 37. People who did not get Tdap at that age  should get it as soon as possible. Tdap is especially important for healthcare professionals and anyone having close contact with a baby younger than 12 months. Pregnant women should get a dose of Tdap during every pregnancy, to protect the newborn from pertussis. Infants are most at risk for severe, life-threatening complications from pertussis. Another vaccine, called Td, protects against tetanus and diphtheria, but not pertussis. A Td booster should be given every 10 years. Tdap may be given as one of these boosters if you have never gotten Tdap before. Tdap may also be given after a severe cut or burn to prevent tetanus infection. Your doctor or the person giving you the vaccine can give you more information. Tdap may safely be given at the same time as other vaccines. 3. Some people should not get this vaccine  A person who has ever had a life-threatening allergic reaction after a previous dose of any diphtheria, tetanus or pertussis containing vaccine, OR has a severe allergy to any part of this vaccine, should not get Tdap vaccine. Tell the person giving the vaccine about any severe allergies.  Anyone who had coma or long repeated seizures within 7 days after a childhood dose of DTP or DTaP, or a previous dose of Tdap, should not get Tdap, unless a cause other than the vaccine was found. They can still get Td.  Talk to your doctor if you: ? have seizures or another nervous system problem, ? had severe pain or swelling after any vaccine containing diphtheria, tetanus or pertussis, ? ever had a condition called Guillain-Barr Syndrome (GBS), ? aren't feeling well on the  day the shot is scheduled. 4. Risks With any medicine, including vaccines, there is a chance of side effects. These are usually mild and go away on their own. Serious reactions are also possible but are rare. Most people who get Tdap vaccine do not have any problems with it. Mild problems following Tdap: (Did not interfere  with activities)  Pain where the shot was given (about 3 in 4 adolescents or 2 in 3 adults)  Redness or swelling where the shot was given (about 1 person in 5)  Mild fever of at least 100.19F (up to about 1 in 25 adolescents or 1 in 100 adults)  Headache (about 3 or 4 people in 10)  Tiredness (about 1 person in 3 or 4)  Nausea, vomiting, diarrhea, stomach ache (up to 1 in 4 adolescents or 1 in 10 adults)  Chills, sore joints (about 1 person in 10)  Body aches (about 1 person in 3 or 4)  Rash, swollen glands (uncommon)  Moderate problems following Tdap: (Interfered with activities, but did not require medical attention)  Pain where the shot was given (up to 1 in 5 or 6)  Redness or swelling where the shot was given (up to about 1 in 16 adolescents or 1 in 12 adults)  Fever over 102F (about 1 in 100 adolescents or 1 in 250 adults)  Headache (about 1 in 7 adolescents or 1 in 10 adults)  Nausea, vomiting, diarrhea, stomach ache (up to 1 or 3 people in 100)  Swelling of the entire arm where the shot was given (up to about 1 in 500).  Severe problems following Tdap: (Unable to perform usual activities; required medical attention)  Swelling, severe pain, bleeding and redness in the arm where the shot was given (rare).  Problems that could happen after any vaccine:  People sometimes faint after a medical procedure, including vaccination. Sitting or lying down for about 15 minutes can help prevent fainting, and injuries caused by a fall. Tell your doctor if you feel dizzy, or have vision changes or ringing in the ears.  Some people get severe pain in the shoulder and have difficulty moving the arm where a shot was given. This happens very rarely.  Any medication can cause a severe allergic reaction. Such reactions from a vaccine are very rare, estimated at fewer than 1 in a million doses, and would happen within a few minutes to a few hours after the vaccination. As with any  medicine, there is a very remote chance of a vaccine causing a serious injury or death. The safety of vaccines is always being monitored. For more information, visit: http://www.aguilar.org/ 5. What if there is a serious problem? What should I look for? Look for anything that concerns you, such as signs of a severe allergic reaction, very high fever, or unusual behavior. Signs of a severe allergic reaction can include hives, swelling of the face and throat, difficulty breathing, a fast heartbeat, dizziness, and weakness. These would usually start a few minutes to a few hours after the vaccination. What should I do?  If you think it is a severe allergic reaction or other emergency that can't wait, call 9-1-1 or get the person to the nearest hospital. Otherwise, call your doctor.  Afterward, the reaction should be reported to the Vaccine Adverse Event Reporting System (VAERS). Your doctor might file this report, or you can do it yourself through the VAERS web site at www.vaers.SamedayNews.es, or by calling 3107982676. ? VAERS does not give  medical advice. 6. The National Vaccine Injury Compensation Program The [employer] Vaccine Injury Compensation Program (VICP) is a federal program that was created to compensate people who may have been injured by certain vaccines. Persons who believe they may have been injured by a vaccine can learn about the program and about filing a claim by calling 1-807-191-0075 or visiting the VICP website at SpiritualWord.atwww.hrsa.gov/vaccinecompensation. There is a time limit to file a claim for compensation. 7. How can I learn more?  Ask your doctor. He or she can give you the vaccine package insert or suggest other sources of information.  Call your local or state health department.  Contact the Centers for Disease Control and Prevention (CDC): ? Call (239)098-51371-206-393-8697 (1-800-CDC-INFO) or ? Visit CDC's website at PicCapture.uywww.cdc.gov/vaccines CDC Tdap Vaccine VIS (06/15/13) This information  is not intended to replace advice given to you by your health care provider. Make sure you discuss any questions you have with your health care provider. Document Released: 10/08/2011 Document Revised: 12/28/2015 Document Reviewed: 12/28/2015 Elsevier Interactive Patient Education  2017 [employer]    Return to clinic for any scheduled appointments or obstetric concerns, or go to [facility]U for evaluation

## 2016-10-18 NOTE — Progress Notes (Signed)
   PRENATAL VISIT NOTE  Subjective:  Sheila [name] is a 31 y.o. G2P1001 at 5933w4d being seen today for ongoing prenatal care.  She is currently monitored for the following issues for this low-risk pregnancy and has Pelvic pain; Supervision of low-risk pregnancy; and Atypical squamous cell changes of undetermined significance (ASCUS) on cervical cytology with negative high risk human papilloma virus (HPV) test result on her problem list.  Patient reports no complaints.  Contractions: Not present. Vag. Bleeding: None.  Movement: Present. Denies leaking of fluid.   The following portions of the patient's history were reviewed and updated as appropriate: allergies, current medications, past family history, past medical history, past social history, past surgical history and problem list. Problem list updated.  Objective:   Vitals:   10/18/16 1058  BP: 102/72  Pulse: 85  Weight: 151 lb (68.5 kg)    Fetal Status: Fetal Heart Rate (bpm): 145 Fundal Height: 23 cm Movement: Present     General:  Alert, oriented and cooperative. Patient is in no acute distress.  Skin: Skin is warm and dry. No rash noted.   Cardiovascular: Normal heart rate noted  Respiratory: Normal respiratory effort, no problems with respiration noted  Abdomen: Soft, gravid, appropriate for gestational age. Pain/Pressure: Present     Pelvic:  Cervical exam deferred        Extremities: Normal range of motion.  Edema: None  Mental Status: Normal mood and affect. Normal behavior. Normal judgment and thought content.   Assessment and Plan:  Pregnancy: G2P1001 at 3033w4d  1. Encounter for supervision of low-risk pregnancy in second trimester Preterm labor symptoms and general obstetric precautions including but not limited to vaginal bleeding, contractions, leaking of fluid and fetal movement were reviewed in detail with the patient. Please refer to After Visit Summary for other counseling recommendations.  Return in about 4  weeks (around 11/15/2016) for 2 hr GTT, 3rd trimester labs,  OB Visit (LOB).   Jaynie CollinsUgonna Anyanwu, MD

## 2016-10-19 ENCOUNTER — Inpatient Hospital Stay (HOSPITAL_COMMUNITY)
Admission: AD | Admit: 2016-10-19 | Discharge: 2016-10-19 | Disposition: A | Discharge status: Home / Self Care | Payer: Self-pay | Source: Ambulatory Visit | Attending: Obstetrics & Gynecology | Admitting: Obstetrics & Gynecology

## 2016-10-19 ENCOUNTER — Inpatient Hospital Stay (HOSPITAL_COMMUNITY): Payer: Self-pay

## 2016-10-19 ENCOUNTER — Encounter (HOSPITAL_COMMUNITY): Payer: Self-pay | Admitting: *Deleted

## 2016-10-19 DIAGNOSIS — O9A212 Injury, poisoning and certain other consequences of external causes complicating pregnancy, second trimester: Secondary | ICD-10-CM

## 2016-10-19 DIAGNOSIS — W19XXXA Unspecified fall, initial encounter: Secondary | ICD-10-CM

## 2016-10-19 DIAGNOSIS — S6990XA Unspecified injury of unspecified wrist, hand and finger(s), initial encounter: Secondary | ICD-10-CM

## 2016-10-19 DIAGNOSIS — M25532 Pain in left wrist: Secondary | ICD-10-CM | POA: Insufficient documentation

## 2016-10-19 DIAGNOSIS — S62022A Displaced fracture of middle third of navicular [scaphoid] bone of left wrist, initial encounter for closed fracture: Secondary | ICD-10-CM

## 2016-10-19 DIAGNOSIS — Z3A23 23 weeks gestation of pregnancy: Secondary | ICD-10-CM | POA: Insufficient documentation

## 2016-10-19 DIAGNOSIS — Z3491 Encounter for supervision of normal pregnancy, unspecified, first trimester: Secondary | ICD-10-CM

## 2016-10-19 DIAGNOSIS — W010XXA Fall on same level from slipping, tripping and stumbling without subsequent striking against object, initial encounter: Secondary | ICD-10-CM | POA: Insufficient documentation

## 2016-10-19 DIAGNOSIS — S6992XA Unspecified injury of left wrist, hand and finger(s), initial encounter: Secondary | ICD-10-CM

## 2016-10-19 DIAGNOSIS — S62102A Fracture of unspecified carpal bone, left wrist, initial encounter for closed fracture: Secondary | ICD-10-CM | POA: Insufficient documentation

## 2016-10-19 DIAGNOSIS — O26892 Other specified pregnancy related conditions, second trimester: Secondary | ICD-10-CM | POA: Insufficient documentation

## 2016-10-19 MED ORDER — OXYCODONE-ACETAMINOPHEN 5-325 MG PO TABS: 1.0000 | ORAL_TABLET | Freq: Four times a day (QID) | ORAL | 0 refills | Status: DC | PRN

## 2016-10-19 MED ORDER — OXYCODONE-ACETAMINOPHEN 5-325 MG PO TABS
2.0000 | ORAL_TABLET | Freq: Four times a day (QID) | ORAL | Status: DC | PRN
  Administered 2016-10-19: 2 via ORAL
  Filled 2016-10-19: qty 2

## 2016-10-19 NOTE — Progress Notes (Signed)
MElanie Bhambri CNM in to discuss d/c plan. Written and verbal d/c instructions given and understanding voiced.
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
## 2016-10-19 NOTE — MAU Note (Addendum)
Pt reports she fell on her buttocks and tried to catch herself with her arms and hurt her left hand/wrist.Hurts to bend. Denies any abd pain or vaginal bleeding.
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
## 2016-11-13 ENCOUNTER — Ambulatory Visit (INDEPENDENT_AMBULATORY_CARE_PROVIDER_SITE_OTHER): Payer: Self-pay | Admitting: Advanced Practice Midwife

## 2016-11-13 ENCOUNTER — Encounter: Payer: Self-pay | Admitting: Advanced Practice Midwife

## 2016-11-13 ENCOUNTER — Encounter: Payer: Self-pay | Admitting: Family Medicine

## 2016-11-13 VITALS — BP 114/65 | HR 79 | Wt 155.5 lb

## 2016-11-13 DIAGNOSIS — Z23 Encounter for immunization: Secondary | ICD-10-CM

## 2016-11-13 DIAGNOSIS — Z3493 Encounter for supervision of normal pregnancy, unspecified, third trimester: Secondary | ICD-10-CM

## 2016-11-13 NOTE — Progress Notes (Signed)
   PRENATAL VISIT NOTE  Subjective:  Sheila [name] is a 31 y.o. G2P1001 at 5856w2d being seen today for ongoing prenatal care.  She is currently monitored for the following issues for this low-risk pregnancy and has Pelvic pain; Supervision of low-risk pregnancy; and Atypical squamous cell changes of undetermined significance (ASCUS) on cervical cytology with negative high risk human papilloma virus (HPV) test result on her problem list.  Patient reports no complaints.  Contractions: Not present. Vag. Bleeding: None.  Movement: Present. Denies leaking of fluid.   The following portions of the patient's history were reviewed and updated as appropriate: allergies, current medications, past family history, past medical history, past social history, past surgical history and problem list. Problem list updated.  Objective:   Vitals:   11/13/16 0809  BP: 114/65  Pulse: 79  Weight: 155 lb 8 oz (70.5 kg)    Fetal Status: Fetal Heart Rate (bpm): 154 Fundal Height: 27 cm Movement: Present     General:  Alert, oriented and cooperative. Patient is in no acute distress.  Skin: Skin is warm and dry. No rash noted.   Cardiovascular: Normal heart rate noted  Respiratory: Normal respiratory effort, no problems with respiration noted  Abdomen: Soft, gravid, appropriate for gestational age.  Pain/Pressure: Present     Pelvic: Cervical exam deferred        Extremities: Normal range of motion.  Edema: None  Mental Status:  Normal mood and affect. Normal behavior. Normal judgment and thought content.   Assessment and Plan:  Pregnancy: G2P1001 at 2456w2d  1. Encounter for supervision of low-risk pregnancy in third trimester  - Glucose Tolerance, 2 Hours w/1 Hour - CBC - HIV antibody (with reflex) - Tdap vaccine greater than or equal to 7yo IM - RPR  Preterm labor symptoms and general obstetric precautions including but not limited to vaginal bleeding, contractions, leaking of fluid and fetal  movement were reviewed in detail with the patient. Please refer to After Visit Summary for other counseling recommendations.  Return in 2 weeks (on 11/27/2016).   Dorathy KinsmanVirginia Bleu Moisan, CNM

## 2016-11-13 NOTE — Progress Notes (Signed)
[employer] interpreter [place] Crane[place]annick [phone]

## 2016-11-13 NOTE — Patient Instructions (Signed)

## 2016-11-14 LAB — GLUCOSE TOLERANCE, 2 HOURS W/ 1HR
GLUCOSE, 1 HOUR: 168 mg/dL (ref 65–179)
GLUCOSE, 2 HOUR: 149 mg/dL (ref 65–152)
GLUCOSE, FASTING: 78 mg/dL (ref 65–91)

## 2016-11-14 LAB — CBC
HEMATOCRIT: 31.2 % — AB (ref 34.0–46.6)
Hemoglobin: 10.2 g/dL — ABNORMAL LOW (ref 11.1–15.9)
MCH: 29.5 pg (ref 26.6–33.0)
MCHC: 32.7 g/dL (ref 31.5–35.7)
MCV: 90 fL (ref 79–97)
Platelets: 291 10*3/uL (ref 150–379)
RBC: 3.46 x10E6/uL — ABNORMAL LOW (ref 3.77–5.28)
RDW: 14.6 % (ref 12.3–15.4)
WBC: 7.9 10*3/uL (ref 3.4–10.8)

## 2016-11-14 LAB — RPR: RPR Ser Ql: NONREACTIVE

## 2016-11-14 LAB — HIV ANTIBODY (ROUTINE TESTING W REFLEX): HIV Screen 4th Generation wRfx: NONREACTIVE

## 2016-11-27 ENCOUNTER — Emergency Department (HOSPITAL_COMMUNITY)
Admission: EM | Admit: 2016-11-27 | Discharge: 2016-11-27 | Disposition: A | Discharge status: Home / Self Care | Payer: Self-pay | Attending: Emergency Medicine | Admitting: Emergency Medicine

## 2016-11-27 ENCOUNTER — Encounter (HOSPITAL_COMMUNITY): Payer: Self-pay | Admitting: *Deleted

## 2016-11-27 ENCOUNTER — Ambulatory Visit (INDEPENDENT_AMBULATORY_CARE_PROVIDER_SITE_OTHER): Payer: Self-pay | Admitting: Obstetrics and Gynecology

## 2016-11-27 VITALS — BP 118/66 | HR 76 | Wt 159.0 lb

## 2016-11-27 DIAGNOSIS — J45909 Unspecified asthma, uncomplicated: Secondary | ICD-10-CM | POA: Insufficient documentation

## 2016-11-27 DIAGNOSIS — Z3493 Encounter for supervision of normal pregnancy, unspecified, third trimester: Secondary | ICD-10-CM

## 2016-11-27 DIAGNOSIS — M25532 Pain in left wrist: Secondary | ICD-10-CM | POA: Insufficient documentation
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
## 2016-11-27 NOTE — ED Triage Notes (Signed)
Pt reports hx of left hand [facility] that [facility], has splint on pta. Reports still having pain and wants the splint removed due to upcoming fingerprints. Pt reports being approx [redacted] weeks pregnant but denies any ob/gyn complaints.
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
## 2016-11-27 NOTE — Progress Notes (Signed)
   PRENATAL VISIT NOTE  Subjective:  Sheila [name] is a 31 y.o. G2P1001 at 6176w2d being seen today for ongoing prenatal care.  She is currently monitored for the following issues for this low-risk pregnancy and has Pelvic pain; Supervision of low-risk pregnancy; and Atypical squamous cell changes of undetermined significance (ASCUS) on cervical cytology with negative high risk human papilloma virus (HPV) test result on her problem list.  Patient reports no complaints.  Contractions: Not present. Vag. Bleeding: None.  Movement: Present. Denies leaking of fluid.   The following portions of the patient's history were reviewed and updated as appropriate: allergies, current medications, past family history, past medical history, past social history, past surgical history and problem list. Problem list updated.  Objective:   Vitals:   11/27/16 0804  BP: 118/66  Pulse: 76  Weight: 159 lb (72.1 kg)    Fetal Status: Fetal Heart Rate (bpm): 135 Fundal Height: 29 cm Movement: Present     General:  Alert, oriented and cooperative. Patient is in no acute distress.  Skin: Skin is warm and dry. No rash noted.   Cardiovascular: Normal heart rate noted  Respiratory: Normal respiratory effort, no problems with respiration noted  Abdomen: Soft, gravid, appropriate for gestational age.  Pain/Pressure: Present     Pelvic: Cervical exam deferred        Extremities: Normal range of motion.  Edema: None  Mental Status:  Normal mood and affect. Normal behavior. Normal judgment and thought content.   Assessment and Plan:  Pregnancy: G2P1001 at 3976w2d  1. Encounter for supervision of low-risk pregnancy in third trimester  - Discussed Glucola, normal.   Preterm labor symptoms and general obstetric precautions including but not limited to vaginal bleeding, contractions, leaking of fluid and fetal movement were reviewed in detail with the patient. Please refer to After Visit Summary for other counseling  recommendations.  No Follow-up on file.   Rasch, Harolyn RutherfordJennifer I, NP

## 2016-11-27 NOTE — Discharge Instructions (Signed)
Can start wearing the velcro wrist splint for comfort. Follow-up with your primary care doctor for any ongoing issues. Return here for any new concerns.

## 2016-11-27 NOTE — ED Notes (Signed)
Splint removed by PA

## 2016-12-11 ENCOUNTER — Ambulatory Visit (INDEPENDENT_AMBULATORY_CARE_PROVIDER_SITE_OTHER): Payer: Self-pay | Admitting: Advanced Practice Midwife

## 2016-12-11 ENCOUNTER — Encounter: Payer: Self-pay | Admitting: Advanced Practice Midwife

## 2016-12-11 VITALS — BP 123/64 | HR 85 | Wt 160.5 lb

## 2016-12-11 DIAGNOSIS — Z3493 Encounter for supervision of normal pregnancy, unspecified, third trimester: Secondary | ICD-10-CM

## 2016-12-11 NOTE — Patient Instructions (Signed)
Third Trimester of Pregnancy The third trimester is from week 28 through week 40 (months 7 through 9). The third trimester is a time when the unborn baby (fetus) is growing rapidly. At the end of the ninth month, the fetus is about 20 inches in length and weighs 6-10 pounds. Body changes during your third trimester Your body will continue to go through many changes during pregnancy. The changes vary from woman to woman. During the third trimester:  Your weight will continue to increase. You can expect to gain 25-35 pounds (11-16 kg) by the end of the pregnancy.  You may begin to get stretch marks on your hips, abdomen, and breasts.  You may urinate more often because the fetus is moving lower into your pelvis and pressing on your bladder.  You may develop or continue to have heartburn. This is caused by increased hormones that slow down muscles in the digestive tract.  You may develop or continue to have constipation because increased hormones slow digestion and cause the muscles that push waste through your intestines to relax.  You may develop hemorrhoids. These are swollen veins (varicose veins) in the rectum that can itch or be painful.  You may develop swollen, bulging veins (varicose veins) in your legs.  You may have increased body aches in the pelvis, back, or thighs. This is due to weight gain and increased hormones that are relaxing your joints.  You may have changes in your hair. These can include thickening of your hair, rapid growth, and changes in texture. Some women also have hair loss during or after pregnancy, or hair that feels dry or thin. Your hair will most likely return to normal after your baby is born.  Your breasts will continue to grow and they will continue to become tender. A yellow fluid (colostrum) may leak from your breasts. This is the first milk you are producing for your baby.  Your belly button may stick out.  You may notice more swelling in your hands,  face, or ankles.  You may have increased tingling or numbness in your hands, arms, and legs. The skin on your belly may also feel numb.  You may feel short of breath because of your expanding uterus.  You may have more problems sleeping. This can be caused by the size of your belly, increased need to urinate, and an increase in your body's metabolism.  You may notice the fetus "dropping," or moving lower in your abdomen (lightening).  You may have increased vaginal discharge.  You may notice your joints feel loose and you may have pain around your pelvic bone.  What to expect at prenatal visits You will have prenatal exams every 2 weeks until week 36. Then you will have weekly prenatal exams. During a routine prenatal visit:  You will be weighed to make sure you and the baby are growing normally.  Your blood pressure will be taken.  Your abdomen will be measured to track your baby's growth.  The fetal heartbeat will be listened to.  Any test results from the previous visit will be discussed.  You may have a cervical check near your due date to see if your cervix has softened or thinned (effaced).  You will be tested for Group B streptococcus. This happens between 35 and 37 weeks.  Your health care provider may ask you:  What your birth plan is.  How you are feeling.  If you are feeling the baby move.  If you have had   any abnormal symptoms, such as leaking fluid, bleeding, severe headaches, or abdominal cramping.  If you are using any tobacco products, including cigarettes, chewing tobacco, and electronic cigarettes.  If you have any questions.  Other tests or screenings that may be performed during your third trimester include:  Blood tests that check for low iron levels (anemia).  Fetal testing to check the health, activity level, and growth of the fetus. Testing is done if you have certain medical conditions or if there are problems during the  pregnancy.  Nonstress test (NST). This test checks the health of your baby to make sure there are no signs of problems, such as the baby not getting enough oxygen. During this test, a belt is placed around your belly. The baby is made to move, and its heart rate is monitored during movement.  What is false labor? False labor is a condition in which you feel small, irregular tightenings of the muscles in the womb (contractions) that usually go away with rest, changing position, or drinking water. These are called Braxton Hicks contractions. Contractions may last for hours, days, or even weeks before true labor sets in. If contractions come at regular intervals, become more frequent, increase in intensity, or become painful, you should see your health care provider. What are the signs of labor?  Abdominal cramps.  Regular contractions that start at 10 minutes apart and become stronger and more frequent with time.  Contractions that start on the top of the uterus and spread down to the lower abdomen and back.  Increased pelvic pressure and dull back pain.  A watery or bloody mucus discharge that comes from the vagina.  Leaking of amniotic fluid. This is also known as your "water breaking." It could be a slow trickle or a gush. Let your health care provider know if it has a color or strange odor. If you have any of these signs, call your health care provider right away, even if it is before your due date. Follow these instructions at home: Medicines  Follow your health care provider's instructions regarding medicine use. Specific medicines may be either safe or unsafe to take during pregnancy.  Take a prenatal vitamin that contains at least 600 micrograms (mcg) of folic acid.  If you develop constipation, try taking a stool softener if your health care provider approves. Eating and drinking  Eat a balanced diet that includes fresh fruits and vegetables, whole grains, good sources of protein  such as meat, eggs, or tofu, and low-fat dairy. Your health care provider will help you determine the amount of weight gain that is right for you.  Avoid raw meat and uncooked cheese. These carry germs that can cause birth defects in the baby.  If you have low calcium intake from food, talk to your health care provider about whether you should take a daily calcium supplement.  Eat four or five small meals rather than three large meals a day.  Limit foods that are high in fat and processed sugars, such as fried and sweet foods.  To prevent constipation: ? Drink enough fluid to keep your urine clear or pale yellow. ? Eat foods that are high in fiber, such as fresh fruits and vegetables, whole grains, and beans. Activity  Exercise only as directed by your health care provider. Most women can continue their usual exercise routine during pregnancy. Try to exercise for 30 minutes at least 5 days a week. Stop exercising if you experience uterine contractions.  Avoid heavy   lifting.  Do not exercise in extreme heat or humidity, or at high altitudes.  Wear low-heel, comfortable shoes.  Practice good posture.  You may continue to have sex unless your health care provider tells you otherwise. Relieving pain and discomfort  Take frequent breaks and rest with your legs elevated if you have leg cramps or low back pain.  Take warm sitz baths to soothe any pain or discomfort caused by hemorrhoids. Use hemorrhoid cream if your health care provider approves.  Wear a good support bra to prevent discomfort from breast tenderness.  If you develop varicose veins: ? Wear support pantyhose or compression stockings as told by your healthcare provider. ? Elevate your feet for 15 minutes, 3-4 times a day. Prenatal care  Write down your questions. Take them to your prenatal visits.  Keep all your prenatal visits as told by your health care provider. This is important. Safety  Wear your seat belt at  all times when driving.  Make a list of emergency phone numbers, including numbers for family, friends, the hospital, and police and fire departments. General instructions  Avoid cat litter boxes and soil used by cats. These carry germs that can cause birth defects in the baby. If you have a cat, ask someone to clean the litter box for you.  Do not travel far distances unless it is absolutely necessary and only with the approval of your health care provider.  Do not use hot tubs, steam rooms, or saunas.  Do not drink alcohol.  Do not use any products that contain nicotine or tobacco, such as cigarettes and e-cigarettes. If you need help quitting, ask your health care provider.  Do not use any medicinal herbs or unprescribed drugs. These chemicals affect the formation and growth of the baby.  Do not douche or use tampons or scented sanitary pads.  Do not cross your legs for long periods of time.  To prepare for the arrival of your baby: ? Take prenatal classes to understand, practice, and ask questions about labor and delivery. ? Make a trial run to the hospital. ? Visit the hospital and tour the maternity area. ? Arrange for maternity or paternity leave through employers. ? Arrange for family and friends to take care of pets while you are in the hospital. ? Purchase a rear-facing car seat and make sure you know how to install it in your car. ? Pack your hospital bag. ? Prepare the baby's nursery. Make sure to remove all pillows and stuffed animals from the baby's crib to prevent suffocation.  Visit your dentist if you have not gone during your pregnancy. Use a soft toothbrush to brush your teeth and be gentle when you floss. Contact a health care provider if:  You are unsure if you are in labor or if your water has broken.  You become dizzy.  You have mild pelvic cramps, pelvic pressure, or nagging pain in your abdominal area.  You have lower back pain.  You have persistent  nausea, vomiting, or diarrhea.  You have an unusual or bad smelling vaginal discharge.  You have pain when you urinate. Get help right away if:  Your water breaks before 37 weeks.  You have regular contractions less than 5 minutes apart before 37 weeks.  You have a fever.  You are leaking fluid from your vagina.  You have spotting or bleeding from your vagina.  You have severe abdominal pain or cramping.  You have rapid weight loss or weight gain.    You have shortness of breath with chest pain.  You notice sudden or extreme swelling of your face, hands, ankles, feet, or legs.  Your baby makes fewer than 10 movements in 2 hours.  You have severe headaches that do not go away when you take medicine.  You have vision changes. Summary  The third trimester is from week 28 through week 40, months 7 through 9. The third trimester is a time when the unborn baby (fetus) is growing rapidly.  During the third trimester, your discomfort may increase as you and your baby continue to gain weight. You may have abdominal, leg, and back pain, sleeping problems, and an increased need to urinate.  During the third trimester your breasts will keep growing and they will continue to become tender. A yellow fluid (colostrum) may leak from your breasts. This is the first milk you are producing for your baby.  False labor is a condition in which you feel small, irregular tightenings of the muscles in the womb (contractions) that eventually go away. These are called Braxton Hicks contractions. Contractions may last for hours, days, or even weeks before true labor sets in.  Signs of labor can include: abdominal cramps; regular contractions that start at 10 minutes apart and become stronger and more frequent with time; watery or bloody mucus discharge that comes from the vagina; increased pelvic pressure and dull back pain; and leaking of amniotic fluid. This information is not intended to replace advice  given to you by your health care provider. Make sure you discuss any questions you have with your health care provider. Document Released: 04/02/2001 Document Revised: 09/14/2015 Document Reviewed: 06/09/2012 Elsevier Interactive Patient Education  2017 [employer]  

## 2016-12-11 NOTE — Progress Notes (Signed)
   PRENATAL VISIT NOTE  Subjective:  Sheila [name] is a 31 y.o. G2P1001 at [redacted]w[redacted]d being seen today for ongoing prenatal care.  She is currently monitored for the following issues for this low-risk pregnancy and has Pelvic pain; Supervision of low-risk pregnancy; and Atypical squamous cell changes of undetermined significance (ASCUS) on cervical cytology with negative high risk human papilloma virus (HPV) test result on her problem list.  Patient reports no complaints.  Contractions: Not present. Vag. Bleeding: None.  Movement: Present. Denies leaking of fluid.   The following portions of the patient's history were reviewed and updated as appropriate: allergies, current medications, past family history, past medical history, past social history, past surgical history and problem list. Problem list updated.  Objective:   Vitals:   12/11/16 0810  BP: 123/64  Pulse: 85  Weight: 160 lb 8 oz (72.8 kg)    Fetal Status: Fetal Heart Rate (bpm): 130 Fundal Height: 31 cm Movement: Present     General:  Alert, oriented and cooperative. Patient is in no acute distress.  Skin: Skin is warm and dry. No rash noted.   Cardiovascular: Normal heart rate noted  Respiratory: Normal respiratory effort, no problems with respiration noted  Abdomen: Soft, gravid, appropriate for gestational age.  Pain/Pressure: Absent     Pelvic: Cervical exam deferred        Extremities: Normal range of motion.  Edema: None  Mental Status:  Normal mood and affect. Normal behavior. Normal judgment and thought content.   Assessment and Plan:  Pregnancy: G2P1001 at [redacted]w[redacted]d  1. Encounter for supervision of low-risk pregnancy in third trimester --Anticipatory guidance about upcoming weeks of pregnancy and next prenatal visits  Preterm labor symptoms and general obstetric precautions including but not limited to vaginal bleeding, contractions, leaking of fluid and fetal movement were reviewed in detail with the  patient. Please refer to After Visit Summary for other counseling recommendations.  Return in about 2 weeks (around 12/25/2016).   Sharen Counter, CNM

## 2016-12-25 ENCOUNTER — Encounter: Payer: Self-pay | Admitting: Advanced Practice Midwife

## 2016-12-25 ENCOUNTER — Ambulatory Visit (INDEPENDENT_AMBULATORY_CARE_PROVIDER_SITE_OTHER): Payer: Self-pay | Admitting: Advanced Practice Midwife

## 2016-12-25 VITALS — BP 119/66 | HR 96 | Wt 162.4 lb

## 2016-12-25 DIAGNOSIS — Z3493 Encounter for supervision of normal pregnancy, unspecified, third trimester: Secondary | ICD-10-CM

## 2016-12-25 NOTE — Patient Instructions (Signed)
Third Trimester of Pregnancy The third trimester is from week 28 through week 40 (months 7 through 9). The third trimester is a time when the unborn baby (fetus) is growing rapidly. At the end of the ninth month, the fetus is about 20 inches in length and weighs 6-10 pounds. Body changes during your third trimester Your body will continue to go through many changes during pregnancy. The changes vary from woman to woman. During the third trimester:  Your weight will continue to increase. You can expect to gain 25-35 pounds (11-16 kg) by the end of the pregnancy.  You may begin to get stretch marks on your hips, abdomen, and breasts.  You may urinate more often because the fetus is moving lower into your pelvis and pressing on your bladder.  You may develop or continue to have heartburn. This is caused by increased hormones that slow down muscles in the digestive tract.  You may develop or continue to have constipation because increased hormones slow digestion and cause the muscles that push waste through your intestines to relax.  You may develop hemorrhoids. These are swollen veins (varicose veins) in the rectum that can itch or be painful.  You may develop swollen, bulging veins (varicose veins) in your legs.  You may have increased body aches in the pelvis, back, or thighs. This is due to weight gain and increased hormones that are relaxing your joints.  You may have changes in your hair. These can include thickening of your hair, rapid growth, and changes in texture. Some women also have hair loss during or after pregnancy, or hair that feels dry or thin. Your hair will most likely return to normal after your baby is born.  Your breasts will continue to grow and they will continue to become tender. A yellow fluid (colostrum) may leak from your breasts. This is the first milk you are producing for your baby.  Your belly button may stick out.  You may notice more swelling in your hands,  face, or ankles.  You may have increased tingling or numbness in your hands, arms, and legs. The skin on your belly may also feel numb.  You may feel short of breath because of your expanding uterus.  You may have more problems sleeping. This can be caused by the size of your belly, increased need to urinate, and an increase in your body's metabolism.  You may notice the fetus "dropping," or moving lower in your abdomen (lightening).  You may have increased vaginal discharge.  You may notice your joints feel loose and you may have pain around your pelvic bone.  What to expect at prenatal visits You will have prenatal exams every 2 weeks until week 36. Then you will have weekly prenatal exams. During a routine prenatal visit:  You will be weighed to make sure you and the baby are growing normally.  Your blood pressure will be taken.  Your abdomen will be measured to track your baby's growth.  The fetal heartbeat will be listened to.  Any test results from the previous visit will be discussed.  You may have a cervical check near your due date to see if your cervix has softened or thinned (effaced).  You will be tested for Group B streptococcus. This happens between 35 and 37 weeks.  Your health care provider may ask you:  What your birth plan is.  How you are feeling.  If you are feeling the baby move.  If you have had   any abnormal symptoms, such as leaking fluid, bleeding, severe headaches, or abdominal cramping.  If you are using any tobacco products, including cigarettes, chewing tobacco, and electronic cigarettes.  If you have any questions.  Other tests or screenings that may be performed during your third trimester include:  Blood tests that check for low iron levels (anemia).  Fetal testing to check the health, activity level, and growth of the fetus. Testing is done if you have certain medical conditions or if there are problems during the  pregnancy.  Nonstress test (NST). This test checks the health of your baby to make sure there are no signs of problems, such as the baby not getting enough oxygen. During this test, a belt is placed around your belly. The baby is made to move, and its heart rate is monitored during movement.  What is false labor? False labor is a condition in which you feel small, irregular tightenings of the muscles in the womb (contractions) that usually go away with rest, changing position, or drinking water. These are called Braxton Hicks contractions. Contractions may last for hours, days, or even weeks before true labor sets in. If contractions come at regular intervals, become more frequent, increase in intensity, or become painful, you should see your health care provider. What are the signs of labor?  Abdominal cramps.  Regular contractions that start at 10 minutes apart and become stronger and more frequent with time.  Contractions that start on the top of the uterus and spread down to the lower abdomen and back.  Increased pelvic pressure and dull back pain.  A watery or bloody mucus discharge that comes from the vagina.  Leaking of amniotic fluid. This is also known as your "water breaking." It could be a slow trickle or a gush. Let your health care provider know if it has a color or strange odor. If you have any of these signs, call your health care provider right away, even if it is before your due date. Follow these instructions at home: Medicines  Follow your health care provider's instructions regarding medicine use. Specific medicines may be either safe or unsafe to take during pregnancy.  Take a prenatal vitamin that contains at least 600 micrograms (mcg) of folic acid.  If you develop constipation, try taking a stool softener if your health care provider approves. Eating and drinking  Eat a balanced diet that includes fresh fruits and vegetables, whole grains, good sources of protein  such as meat, eggs, or tofu, and low-fat dairy. Your health care provider will help you determine the amount of weight gain that is right for you.  Avoid raw meat and uncooked cheese. These carry germs that can cause birth defects in the baby.  If you have low calcium intake from food, talk to your health care provider about whether you should take a daily calcium supplement.  Eat four or five small meals rather than three large meals a day.  Limit foods that are high in fat and processed sugars, such as fried and sweet foods.  To prevent constipation: ? Drink enough fluid to keep your urine clear or pale yellow. ? Eat foods that are high in fiber, such as fresh fruits and vegetables, whole grains, and beans. Activity  Exercise only as directed by your health care provider. Most women can continue their usual exercise routine during pregnancy. Try to exercise for 30 minutes at least 5 days a week. Stop exercising if you experience uterine contractions.  Avoid heavy   lifting.  Do not exercise in extreme heat or humidity, or at high altitudes.  Wear low-heel, comfortable shoes.  Practice good posture.  You may continue to have sex unless your health care provider tells you otherwise. Relieving pain and discomfort  Take frequent breaks and rest with your legs elevated if you have leg cramps or low back pain.  Take warm sitz baths to soothe any pain or discomfort caused by hemorrhoids. Use hemorrhoid cream if your health care provider approves.  Wear a good support bra to prevent discomfort from breast tenderness.  If you develop varicose veins: ? Wear support pantyhose or compression stockings as told by your healthcare provider. ? Elevate your feet for 15 minutes, 3-4 times a day. Prenatal care  Write down your questions. Take them to your prenatal visits.  Keep all your prenatal visits as told by your health care provider. This is important. Safety  Wear your seat belt at  all times when driving.  Make a list of emergency phone numbers, including numbers for family, friends, the hospital, and police and fire departments. General instructions  Avoid cat litter boxes and soil used by cats. These carry germs that can cause birth defects in the baby. If you have a cat, ask someone to clean the litter box for you.  Do not travel far distances unless it is absolutely necessary and only with the approval of your health care provider.  Do not use hot tubs, steam rooms, or saunas.  Do not drink alcohol.  Do not use any products that contain nicotine or tobacco, such as cigarettes and e-cigarettes. If you need help quitting, ask your health care provider.  Do not use any medicinal herbs or unprescribed drugs. These chemicals affect the formation and growth of the baby.  Do not douche or use tampons or scented sanitary pads.  Do not cross your legs for long periods of time.  To prepare for the arrival of your baby: ? Take prenatal classes to understand, practice, and ask questions about labor and delivery. ? Make a trial run to the hospital. ? Visit the hospital and tour the maternity area. ? Arrange for maternity or paternity leave through employers. ? Arrange for family and friends to take care of pets while you are in the hospital. ? Purchase a rear-facing car seat and make sure you know how to install it in your car. ? Pack your hospital bag. ? Prepare the baby's nursery. Make sure to remove all pillows and stuffed animals from the baby's crib to prevent suffocation.  Visit your dentist if you have not gone during your pregnancy. Use a soft toothbrush to brush your teeth and be gentle when you floss. Contact a health care provider if:  You are unsure if you are in labor or if your water has broken.  You become dizzy.  You have mild pelvic cramps, pelvic pressure, or nagging pain in your abdominal area.  You have lower back pain.  You have persistent  nausea, vomiting, or diarrhea.  You have an unusual or bad smelling vaginal discharge.  You have pain when you urinate. Get help right away if:  Your water breaks before 37 weeks.  You have regular contractions less than 5 minutes apart before 37 weeks.  You have a fever.  You are leaking fluid from your vagina.  You have spotting or bleeding from your vagina.  You have severe abdominal pain or cramping.  You have rapid weight loss or weight gain.    You have shortness of breath with chest pain.  You notice sudden or extreme swelling of your face, hands, ankles, feet, or legs.  Your baby makes fewer than 10 movements in 2 hours.  You have severe headaches that do not go away when you take medicine.  You have vision changes. Summary  The third trimester is from week 28 through week 40, months 7 through 9. The third trimester is a time when the unborn baby (fetus) is growing rapidly.  During the third trimester, your discomfort may increase as you and your baby continue to gain weight. You may have abdominal, leg, and back pain, sleeping problems, and an increased need to urinate.  During the third trimester your breasts will keep growing and they will continue to become tender. A yellow fluid (colostrum) may leak from your breasts. This is the first milk you are producing for your baby.  False labor is a condition in which you feel small, irregular tightenings of the muscles in the womb (contractions) that eventually go away. These are called Braxton Hicks contractions. Contractions may last for hours, days, or even weeks before true labor sets in.  Signs of labor can include: abdominal cramps; regular contractions that start at 10 minutes apart and become stronger and more frequent with time; watery or bloody mucus discharge that comes from the vagina; increased pelvic pressure and dull back pain; and leaking of amniotic fluid. This information is not intended to replace advice  given to you by your health care provider. Make sure you discuss any questions you have with your health care provider. Document Released: 04/02/2001 Document Revised: 09/14/2015 Document Reviewed: 06/09/2012 Elsevier Interactive Patient Education  2017 [employer]  

## 2016-12-25 NOTE — Progress Notes (Signed)
Patient here today as a routine OB visit 3441w2d.

## 2016-12-25 NOTE — Progress Notes (Signed)
   PRENATAL VISIT NOTE  Subjective:  Sheila [name] is a 31 y.o. G2P1001 at 4315w2d being seen today for ongoing prenatal care.  She is currently monitored for the following issues for this low-risk pregnancy and has Supervision of low-risk pregnancy and Atypical squamous cell changes of undetermined significance (ASCUS) on cervical cytology with negative high risk human papilloma virus (HPV) test result on her problem list.  Patient reports occasional contractions.  Contractions: Irregular. Vag. Bleeding: None.  Movement: Present. Denies leaking of fluid.   The following portions of the patient's history were reviewed and updated as appropriate: allergies, current medications, past family history, past medical history, past social history, past surgical history and problem list. Problem list updated.  Objective:   Vitals:   12/25/16 0933  BP: 119/66  Pulse: 96  Weight: 162 lb 6.4 oz (73.7 kg)    Fetal Status: Fetal Heart Rate (bpm): 145   Movement: Present     General:  Alert, oriented and cooperative. Patient is in no acute distress.  Skin: Skin is warm and dry. No rash noted.   Cardiovascular: Normal heart rate noted  Respiratory: Normal respiratory effort, no problems with respiration noted  Abdomen: Soft, gravid, appropriate for gestational age.  Pain/Pressure: Absent     Pelvic: Cervical exam deferred        Extremities: Normal range of motion.  Edema: None  Mental Status:  Normal mood and affect. Normal behavior. Normal judgment and thought content.   Assessment and Plan:  Pregnancy: G2P1001 at 7315w2d  1. Encounter for supervision of low-risk pregnancy in third trimester --Anticipatory guidance about next weeks of pregnancy/next prenatal visits  Preterm labor symptoms and general obstetric precautions including but not limited to vaginal bleeding, contractions, leaking of fluid and fetal movement were reviewed in detail with the patient. Please refer to After Visit Summary  for other counseling recommendations.  No Follow-up on file.   Sharen CounterLisa Leftwich-Kirby, CNM

## 2017-01-08 ENCOUNTER — Ambulatory Visit (INDEPENDENT_AMBULATORY_CARE_PROVIDER_SITE_OTHER): Payer: Self-pay | Admitting: Medical

## 2017-01-08 VITALS — BP 124/72 | HR 82 | Wt 164.8 lb

## 2017-01-08 DIAGNOSIS — Z3493 Encounter for supervision of normal pregnancy, unspecified, third trimester: Secondary | ICD-10-CM

## 2017-01-08 NOTE — Progress Notes (Signed)
   PRENATAL VISIT NOTE  Subjective:  Sheila [name] is a 31 y.o. G2P1001 at [redacted]w[redacted]d being seen today for ongoing prenatal care.  She is currently monitored for the following issues for this low-risk pregnancy and has Supervision of low-risk pregnancy and Atypical squamous cell changes of undetermined significance (ASCUS) on cervical cytology with negative high risk human papilloma virus (HPV) test result on her problem list.  Patient reports no complaints.  Contractions: Not present. Vag. Bleeding: None.  Movement: Present. Denies leaking of fluid.   The following portions of the patient's history were reviewed and updated as appropriate: allergies, current medications, past family history, past medical history, past social history, past surgical history and problem list. Problem list updated.  Objective:   Vitals:   01/08/17 1116  BP: 124/72  Pulse: 82  Weight: 164 lb 12.8 oz (74.8 kg)    Fetal Status: Fetal Heart Rate (bpm): 150 Fundal Height: 34 cm Movement: Present     General:  Alert, oriented and cooperative. Patient is in no acute distress.  Skin: Skin is warm and dry. No rash noted.   Cardiovascular: Normal heart rate noted  Respiratory: Normal respiratory effort, no problems with respiration noted  Abdomen: Soft, gravid, appropriate for gestational age.  Pain/Pressure: Absent     Pelvic: Cervical exam deferred        Extremities: Normal range of motion.  Edema: None  Mental Status:  Normal mood and affect. Normal behavior. Normal judgment and thought content.   Assessment and Plan:  Pregnancy: G2P1001 at [redacted]w[redacted]d  1. Encounter for supervision of low-risk pregnancy in third trimester - Patient doing well - GCS and GC/Chlamydia at next visit discussed with patient   Preterm labor symptoms and general obstetric precautions including but not limited to vaginal bleeding, contractions, leaking of fluid and fetal movement were reviewed in detail with the patient. Please refer to  After Visit Summary for other counseling recommendations.  Return in about 1 week (around 01/15/2017) for LOB.   Vonzella Nipple, PA-C

## 2017-01-08 NOTE — Patient Instructions (Signed)
Fetal Movement Counts °Patient Name: ________________________________________________ Patient Due Date: ____________________ °What is a fetal movement count? °A fetal movement count is the number of times that you feel your baby move during a certain amount of time. This may also be called a fetal kick count. A fetal movement count is recommended for every pregnant woman. You may be asked to start counting fetal movements as early as week 28 of your pregnancy. °Pay attention to when your baby is most active. You may notice your baby's sleep and wake cycles. You may also notice things that make your baby move more. You should do a fetal movement count: °· When your baby is normally most active. °· At the same time each day. ° °A good time to count movements is while you are resting, after having something to eat and drink. °How do I count fetal movements? °1. Find a quiet, comfortable area. Sit, or lie down on your side. °2. Write down the date, the start time and stop time, and the number of movements that you felt between those two times. Take this information with you to your health care visits. °3. For 2 hours, count kicks, flutters, swishes, rolls, and jabs. You should feel at least 10 movements during 2 hours. °4. You may stop counting after you have felt 10 movements. °5. If you do not feel 10 movements in 2 hours, have something to eat and drink. Then, keep resting and counting for 1 hour. If you feel at least 4 movements during that hour, you may stop counting. °Contact a health care provider if: °· You feel fewer than 4 movements in 2 hours. °· Your baby is not moving like he or she usually does. °Date: ____________ Start time: ____________ Stop time: ____________ Movements: ____________ °Date: ____________ Start time: ____________ Stop time: ____________ Movements: ____________ °Date: ____________ Start time: ____________ Stop time: ____________ Movements: ____________ °Date: ____________ Start time:  ____________ Stop time: ____________ Movements: ____________ °Date: ____________ Start time: ____________ Stop time: ____________ Movements: ____________ °Date: ____________ Start time: ____________ Stop time: ____________ Movements: ____________ °Date: ____________ Start time: ____________ Stop time: ____________ Movements: ____________ °Date: ____________ Start time: ____________ Stop time: ____________ Movements: ____________ °Date: ____________ Start time: ____________ Stop time: ____________ Movements: ____________ °This information is not intended to replace advice given to you by your health care provider. Make sure you discuss any questions you have with your health care provider. °Document Released: 05/08/2006 Document Revised: 12/06/2015 Document Reviewed: 05/18/2015 °Elsevier Interactive Patient Education © 2018 [employer] °Braxton Hicks Contractions °Contractions of the uterus can occur throughout pregnancy, but they are not always a sign that you are in labor. You may have practice contractions called Braxton Hicks contractions. These false labor contractions are sometimes confused with true labor. °What are Braxton Hicks contractions? °Braxton Hicks contractions are tightening movements that occur in the muscles of the uterus before labor. Unlike true labor contractions, these contractions do not result in opening (dilation) and thinning of the cervix. Toward the end of pregnancy (32-34 weeks), Braxton Hicks contractions can happen more often and may become stronger. These contractions are sometimes difficult to tell apart from true labor because they can be very uncomfortable. You should not feel embarrassed if you go to the hospital with false labor. °Sometimes, the only way to tell if you are in true labor is for your health care provider to look for changes in the cervix. The health care provider will do a physical exam and may monitor your contractions. If   you are not in true labor, the exam  should show that your cervix is not dilating and your water has not broken. °If there are no prenatal problems or other health problems associated with your pregnancy, it is completely safe for you to be sent home with false labor. You may continue to have Braxton Hicks contractions until you go into true labor. °How can I tell the difference between true labor and false labor? °· Differences °? False labor °? Contractions last 30-70 seconds.: Contractions are usually shorter and not as strong as true labor contractions. °? Contractions become very regular.: Contractions are usually irregular. °? Discomfort is usually felt in the top of the uterus, and it spreads to the lower abdomen and low back.: Contractions are often felt in the front of the lower abdomen and in the groin. °? Contractions do not go away with walking.: Contractions may go away when you walk around or change positions while lying down. °? Contractions usually become more intense and increase in frequency.: Contractions get weaker and are shorter-lasting as time goes on. °? The cervix dilates and gets thinner.: The cervix usually does not dilate or become thin. °Follow these instructions at home: °· Take over-the-counter and prescription medicines only as told by your health care provider. °· Keep up with your usual exercises and follow other instructions from your health care provider. °· Eat and drink lightly if you think you are going into labor. °· If Braxton Hicks contractions are making you uncomfortable: °? Change your position from lying down or resting to walking, or change from walking to resting. °? Sit and rest in a tub of warm water. °? Drink enough fluid to keep your urine clear or pale yellow. Dehydration may cause these contractions. °? Do slow and deep breathing several times an hour. °· Keep all follow-up prenatal visits as told by your health care provider. This is important. °Contact a health care provider if: °· You have a  fever. °· You have continuous pain in your abdomen. °Get help right away if: °· Your contractions become stronger, more regular, and closer together. °· You have fluid leaking or gushing from your vagina. °· You pass blood-tinged mucus (bloody show). °· You have bleeding from your vagina. °· You have low back pain that you never had before. °· You feel your baby’s head pushing down and causing pelvic pressure. °· Your baby is not moving inside you as much as it used to. °Summary °· Contractions that occur before labor are called Braxton Hicks contractions, false labor, or practice contractions. °· Braxton Hicks contractions are usually shorter, weaker, farther apart, and less regular than true labor contractions. True labor contractions usually become progressively stronger and regular and they become more frequent. °· Manage discomfort from Braxton Hicks contractions by changing position, resting in a warm bath, drinking plenty of water, or practicing deep breathing. °This information is not intended to replace advice given to you by your health care provider. Make sure you discuss any questions you have with your health care provider. °Document Released: 04/08/2005 Document Revised: 02/26/2016 Document Reviewed: 02/26/2016 °Elsevier Interactive Patient Education © 2017 Elsevier Inc. ° °

## 2017-01-15 ENCOUNTER — Encounter: Payer: Self-pay | Admitting: Medical

## 2017-01-15 ENCOUNTER — Encounter: Payer: Self-pay | Admitting: Advanced Practice Midwife

## 2017-01-21 ENCOUNTER — Ambulatory Visit (INDEPENDENT_AMBULATORY_CARE_PROVIDER_SITE_OTHER): Payer: Self-pay | Admitting: Obstetrics and Gynecology

## 2017-01-21 DIAGNOSIS — Z3493 Encounter for supervision of normal pregnancy, unspecified, third trimester: Secondary | ICD-10-CM

## 2017-01-21 LAB — OB RESULTS CONSOLE GC/CHLAMYDIA: GC PROBE AMP, GENITAL: NEGATIVE

## 2017-01-21 NOTE — Progress Notes (Signed)
   PRENATAL VISIT NOTE  Subjective:  Sheila [name] is a 31 y.o. G2P1001 at [redacted]w[redacted]d being seen today for ongoing prenatal care.  She is currently monitored for the following issues for this low-risk pregnancy and has Supervision of low-risk pregnancy and Atypical squamous cell changes of undetermined significance (ASCUS) on cervical cytology with negative high risk human papilloma virus (HPV) test result on her problem list.  Patient reports no complaints.  Contractions: Not present. Vag. Bleeding: None.  Movement: Present. Denies leaking of fluid.   The following portions of the patient's history were reviewed and updated as appropriate: allergies, current medications, past family history, past medical history, past social history, past surgical history and problem list. Problem list updated.  Objective:   Vitals:   01/21/17 1529  BP: 117/73  Pulse: 81  Weight: 170 lb (77.1 kg)    Fetal Status: Fetal Heart Rate (bpm): 160 Fundal Height: 37 cm Movement: Present     General:  Alert, oriented and cooperative. Patient is in no acute distress.  Skin: Skin is warm and dry. No rash noted.   Cardiovascular: Normal heart rate noted  Respiratory: Normal respiratory effort, no problems with respiration noted  Abdomen: Soft, gravid, appropriate for gestational age.  Pain/Pressure: Absent     Pelvic: Cervical exam performed Dilation: Fingertip Effacement (%): Thick Station: -3  Extremities: Normal range of motion.  Edema: None  Mental Status:  Normal mood and affect. Normal behavior. Normal judgment and thought content.   Assessment and Plan:  Pregnancy: G2P1001 at [redacted]w[redacted]d  1. Encounter for supervision of low-risk pregnancy in third trimester  - Doing well.  - Culture, beta strep (group b only) - GC/Chlamydia probe amp ([place])not at [facility]  There are no diagnoses linked to this encounter. Term labor symptoms and general obstetric precautions including but not limited to vaginal  bleeding, contractions, leaking of fluid and fetal movement were reviewed in detail with the patient. Please refer to After Visit Summary for other counseling recommendations.  No Follow-up on file.   Venia Carbon, NP

## 2017-01-23 ENCOUNTER — Encounter: Payer: Self-pay | Admitting: Obstetrics and Gynecology

## 2017-01-23 LAB — GC/CHLAMYDIA PROBE AMP (~~LOC~~) NOT AT ARMC
Chlamydia: NEGATIVE
NEISSERIA GONORRHEA: NEGATIVE

## 2017-01-24 LAB — CULTURE, BETA STREP (GROUP B ONLY): Strep Gp B Culture: POSITIVE — AB

## 2017-01-25 ENCOUNTER — Encounter: Payer: Self-pay | Admitting: Obstetrics and Gynecology

## 2017-01-25 DIAGNOSIS — O99820 Streptococcus B carrier state complicating pregnancy: Secondary | ICD-10-CM | POA: Insufficient documentation

## 2017-01-27 ENCOUNTER — Telehealth: Payer: Self-pay | Admitting: *Deleted

## 2017-01-27 NOTE — Telephone Encounter (Signed)
Patient left message on nurse voicemail on 01/24/17 at 1243.  States she has a broken hand and is due 02/10/17.  States she needs the provider to write a letter to get someone to come help her.  Requests a return call.

## 2017-01-28 NOTE — Telephone Encounter (Signed)
Called patient, no answer- left message stating we are trying to reach you to return your phone call, please call us back. Per chart review, patient has appt tomorrow- can address then

## 2017-01-29 ENCOUNTER — Encounter: Payer: Self-pay | Admitting: Family Medicine

## 2017-01-29 ENCOUNTER — Ambulatory Visit (INDEPENDENT_AMBULATORY_CARE_PROVIDER_SITE_OTHER): Payer: Self-pay | Admitting: Obstetrics & Gynecology

## 2017-01-29 VITALS — BP 137/78 | HR 80 | Wt 170.6 lb

## 2017-01-29 DIAGNOSIS — Z3483 Encounter for supervision of other normal pregnancy, third trimester: Secondary | ICD-10-CM

## 2017-01-29 DIAGNOSIS — O99820 Streptococcus B carrier state complicating pregnancy: Secondary | ICD-10-CM

## 2017-01-29 DIAGNOSIS — Z3493 Encounter for supervision of normal pregnancy, unspecified, third trimester: Secondary | ICD-10-CM

## 2017-01-29 NOTE — Progress Notes (Signed)
   PRENATAL VISIT NOTE  Subjective:  Sheila [name] is a 31 y.o. G2P1001 at [redacted]w[redacted]d being seen today for ongoing prenatal care.  She is currently monitored for the following issues for this low-risk pregnancy and has Supervision of low-risk pregnancy; Atypical squamous cell changes of undetermined significance (ASCUS) on cervical cytology with negative high risk human papilloma virus (HPV) test result; and GBS (group B Streptococcus carrier), +RV culture, currently pregnant on her problem list.  Patient reports no complaints.  Contractions: Not present. Vag. Bleeding: None.  Movement: Present. Denies leaking of fluid.   The following portions of the patient's history were reviewed and updated as appropriate: allergies, current medications, past family history, past medical history, past social history, past surgical history and problem list. Problem list updated.  Objective:   Vitals:   01/29/17 1515  BP: 137/78  Pulse: 80  Weight: 170 lb 9.6 oz (77.4 kg)    Fetal Status: Fetal Heart Rate (bpm): 142   Movement: Present     General:  Alert, oriented and cooperative. Patient is in no acute distress.  Skin: Skin is warm and dry. No rash noted.   Cardiovascular: Normal heart rate noted  Respiratory: Normal respiratory effort, no problems with respiration noted  Abdomen: Soft, gravid, appropriate for gestational age.  Pain/Pressure: Present     Pelvic: Cervical exam performed        Extremities: Normal range of motion.  Edema: None  Mental Status:  Normal mood and affect. Normal behavior. Normal judgment and thought content.   Assessment and Plan:  Pregnancy: G2P1001 at [redacted]w[redacted]d  1. Encounter for supervision of low-risk pregnancy in third trimester   2. GBS (group B Streptococcus carrier), +RV culture, currently pregnant Treat in labor  Preterm labor symptoms and general obstetric precautions including but not limited to vaginal bleeding, contractions, leaking of fluid and fetal  movement were reviewed in detail with the patient. Please refer to After Visit Summary for other counseling recommendations.  No Follow-up on file.   Allie Bossier, MD

## 2017-02-06 ENCOUNTER — Ambulatory Visit (INDEPENDENT_AMBULATORY_CARE_PROVIDER_SITE_OTHER): Payer: Self-pay | Admitting: Student

## 2017-02-06 VITALS — BP 122/74 | HR 57 | Wt 173.0 lb

## 2017-02-06 DIAGNOSIS — Z3493 Encounter for supervision of normal pregnancy, unspecified, third trimester: Secondary | ICD-10-CM

## 2017-02-06 DIAGNOSIS — O99820 Streptococcus B carrier state complicating pregnancy: Secondary | ICD-10-CM

## 2017-02-06 NOTE — Addendum Note (Signed)
Addended by: Chrystie NoseKOOISTRA, KATHRYN L on: 02/06/2017 04:47 PM   Modules accepted: Orders, SmartSet

## 2017-02-06 NOTE — Progress Notes (Signed)
IOL scheduled 10/29 @ 7am

## 2017-02-06 NOTE — Patient Instructions (Signed)
Labor Induction Labor induction is when steps are taken to cause a pregnant woman to begin the labor process. Most women go into labor on their own between 37 weeks and 42 weeks of the pregnancy. When this does not happen or when there is a medical need, methods may be used to induce labor. Labor induction causes a pregnant woman's uterus to contract. It also causes the cervix to soften (ripen), open (dilate), and thin out (efface). Usually, labor is not induced before 39 weeks of the pregnancy unless there is a problem with the baby or mother. Before inducing labor, your health care provider will consider a number of factors, including the following:  The medical condition of you and the baby.  How many weeks along you are.  The status of the baby's lung maturity.  The condition of the cervix.  The position of the baby. What are the reasons for labor induction? Labor may be induced for the following reasons:  The health of the baby or mother is at risk.  The pregnancy is overdue by 1 week or more.  The water breaks but labor does not start on its own.  The mother has a health condition or serious illness, such as high blood pressure, infection, placental abruption, or diabetes.  The amniotic fluid amounts are low around the baby.  The baby is distressed. Convenience or wanting the baby to be born on a certain date is not a reason for inducing labor. What methods are used for labor induction? Several methods of labor induction may be used, such as:  Prostaglandin medicine. This medicine causes the cervix to dilate and ripen. The medicine will also start contractions. It can be taken by mouth or by inserting a suppository into the vagina.  Inserting a thin tube (catheter) with a balloon on the end into the vagina to dilate the cervix. Once inserted, the balloon is expanded with water, which causes the cervix to open.  Stripping the membranes. Your health care provider separates  amniotic sac tissue from the cervix, causing the cervix to be stretched and causing the release of a hormone called progesterone. This may cause the uterus to contract. It is often done during an office visit. You will be sent home to wait for the contractions to begin. You will then come in for an induction.  Breaking the water. Your health care provider makes a hole in the amniotic sac using a small instrument. Once the amniotic sac breaks, contractions should begin. This may still take hours to see an effect.  Medicine to trigger or strengthen contractions. This medicine is given through an IV access tube inserted into a vein in your arm. All of the methods of induction, besides stripping the membranes, will be done in the hospital. Induction is done in the hospital so that you and the baby can be carefully monitored. How long does it take for labor to be induced? Some inductions can take up to 2-3 days. Depending on the cervix, it usually takes less time. It takes longer when you are induced early in the pregnancy or if this is your first pregnancy. If a mother is still pregnant and the induction has been going on for 2-3 days, either the mother will be sent home or a cesarean delivery will be needed. What are the risks associated with labor induction? Some of the risks of induction include:  Changes in fetal heart rate, such as too high, too low, or erratic.  Fetal distress.    Chance of infection for the mother and baby.  Increased chance of having a cesarean delivery.  Breaking off (abruption) of the placenta from the uterus (rare).  Uterine rupture (very rare). When induction is needed for medical reasons, the benefits of induction may outweigh the risks. What are some reasons for not inducing labor? Labor induction should not be done if:  It is shown that your baby does not tolerate labor.  You have had previous surgeries on your uterus, such as a myomectomy or the removal of  fibroids.  Your placenta lies very low in the uterus and blocks the opening of the cervix (placenta previa).  Your baby is not in a head-down position.  The umbilical cord drops down into the birth canal in front of the baby. This could cut off the baby's blood and oxygen supply.  You have had a previous cesarean delivery.  There are unusual circumstances, such as the baby being extremely premature. This information is not intended to replace advice given to you by your health care provider. Make sure you discuss any questions you have with your health care provider. Document Released: 08/28/2006 Document Revised: 09/14/2015 Document Reviewed: 11/05/2012 Elsevier Interactive Patient Education  2017 Elsevier Inc.  

## 2017-02-06 NOTE — Addendum Note (Signed)
Addended by: Chrystie NoseKOOISTRA, KATHRYN L on: 02/06/2017 03:20 PM   Modules accepted: Kipp BroodSmartSet

## 2017-02-06 NOTE — Progress Notes (Signed)
   PRENATAL VISIT NOTE  Subjective:  Sheila [name] is a 31 y.o. G2P1001 at 7026w3d being seen today for ongoing prenatal care.  She is currently monitored for the following issues for this low-risk pregnancy and has Supervision of low-risk pregnancy; Atypical squamous cell changes of undetermined significance (ASCUS) on cervical cytology with negative high risk human papilloma virus (HPV) test result; and GBS (group B Streptococcus carrier), +RV culture, currently pregnant on her problem list.  Patient reports no complaints.  Contractions: Not present. Vag. Bleeding: None.  Movement: Present. Denies leaking of fluid.   The following portions of the patient's history were reviewed and updated as appropriate: allergies, current medications, past family history, past medical history, past social history, past surgical history and problem list. Problem list updated.  Objective:   Vitals:   02/06/17 1409  BP: 122/74  Pulse: (!) 57  Weight: 173 lb (78.5 kg)    Fetal Status: Fetal Heart Rate (bpm): 148 Fundal Height: 39 cm Movement: Present     General:  Alert, oriented and cooperative. Patient is in no acute distress.  Skin: Skin is warm and dry. No rash noted.   Cardiovascular: Normal heart rate noted  Respiratory: Normal respiratory effort, no problems with respiration noted  Abdomen: Soft, gravid, appropriate for gestational age.  Pain/Pressure: Present     Pelvic: Cervical exam deferred        Extremities: Normal range of motion.  Edema: None  Mental Status:  Normal mood and affect. Normal behavior. Normal judgment and thought content.   Assessment and Plan:  Pregnancy: G2P1001 at 5526w3d  1. Encounter for supervision of low-risk pregnancy in third trimester Plan for NST/BPP next week; induction scheduled for 02-17-2017  2. GBS (group B Streptococcus carrier), +RV culture, currently pregnant Plan for tx in labor  Term labor symptoms and general obstetric precautions including but  not limited to vaginal bleeding, contractions, leaking of fluid and fetal movement were reviewed in detail with the patient. Please refer to After Visit Summary for other counseling recommendations.  Return in about 1 week (around 02/13/2017), or ROb and BPP/NST.   Marylene LandKathryn Lorraine Kooistra, CNM

## 2017-02-10 ENCOUNTER — Telehealth (HOSPITAL_COMMUNITY): Payer: Self-pay | Admitting: *Deleted

## 2017-02-10 NOTE — Telephone Encounter (Signed)
Preadmission screen Interpreter number [id number] 

## 2017-02-14 ENCOUNTER — Ambulatory Visit (INDEPENDENT_AMBULATORY_CARE_PROVIDER_SITE_OTHER): Payer: Self-pay | Admitting: *Deleted

## 2017-02-14 ENCOUNTER — Ambulatory Visit: Payer: Self-pay

## 2017-02-14 ENCOUNTER — Ambulatory Visit (INDEPENDENT_AMBULATORY_CARE_PROVIDER_SITE_OTHER): Payer: Self-pay | Admitting: Family Medicine

## 2017-02-14 VITALS — BP 123/81 | HR 94 | Wt 171.2 lb

## 2017-02-14 DIAGNOSIS — O480 Post-term pregnancy: Secondary | ICD-10-CM

## 2017-02-14 DIAGNOSIS — Z3493 Encounter for supervision of normal pregnancy, unspecified, third trimester: Secondary | ICD-10-CM

## 2017-02-14 DIAGNOSIS — O99820 Streptococcus B carrier state complicating pregnancy: Secondary | ICD-10-CM

## 2017-02-14 NOTE — Progress Notes (Signed)

## 2017-02-14 NOTE — Progress Notes (Signed)
   PRENATAL VISIT NOTE  Subjective:  Sheila [name] is a 31 y.o. G2P1001 at 5664w4d being seen today for ongoing prenatal care.  She is currently monitored for the following issues for this low-risk pregnancy and has Supervision of low-risk pregnancy; Atypical squamous cell changes of undetermined significance (ASCUS) on cervical cytology with negative high risk human papilloma virus (HPV) test result; and GBS (group B Streptococcus carrier), +RV culture, currently pregnant on her problem list.  Patient reports no complaints.  Contractions: Irregular. Vag. Bleeding: None.  Movement: Present. Denies leaking of fluid.   The following portions of the patient's history were reviewed and updated as appropriate: allergies, current medications, past family history, past medical history, past social history, past surgical history and problem list. Problem list updated.  Objective:   Vitals:   02/14/17 1042  BP: 123/81  Pulse: 94  Weight: 171 lb 3.2 oz (77.7 kg)    Fetal Status: Fetal Heart Rate (bpm): NST   Movement: Present     General:  Alert, oriented and cooperative. Patient is in no acute distress.  Skin: Skin is warm and dry. No rash noted.   Cardiovascular: Normal heart rate noted  Respiratory: Normal respiratory effort, no problems with respiration noted  Abdomen: Soft, gravid, appropriate for gestational age.  Pain/Pressure: Present     Pelvic: Cervical exam deferred        Extremities: Normal range of motion.  Edema: None  Mental Status:  Normal mood and affect. Normal behavior. Normal judgment and thought content.   Assessment and Plan:  Pregnancy: G2P1001 at 6364w4d  1. Encounter for supervision of low-risk pregnancy in third trimester NST normal. Induction for 41 weeks scheduled.  2. Post term pregnancy, antepartum condition or complication NST reactive BPP 10/10. AFI normal  3. GBS (group B Streptococcus carrier), +RV culture, currently pregnant Intrapartum  prophylaxis  Term labor symptoms and general obstetric precautions including but not limited to vaginal bleeding, contractions, leaking of fluid and fetal movement were reviewed in detail with the patient. Please refer to After Visit Summary for other counseling recommendations.  Return in about 5 weeks (around 03/21/2017) for PP visit.  IOL on 10/29.Levie Heritage.   Elide Stalzer J Devontre Siedschlag, DO

## 2017-02-14 NOTE — Progress Notes (Signed)
IOL scheduled 10/29 @ 0700  [place] interpreter [phone] used for encounter today.

## 2017-02-17 ENCOUNTER — Inpatient Hospital Stay (HOSPITAL_COMMUNITY)
Admission: RE | Admit: 2017-02-17 | Discharge: 2017-02-19 | DRG: 807 | Disposition: A | Discharge status: Home / Self Care | Payer: Medicaid Other | Source: Ambulatory Visit | Attending: Obstetrics & Gynecology | Admitting: Obstetrics & Gynecology

## 2017-02-17 ENCOUNTER — Encounter (HOSPITAL_COMMUNITY): Payer: Self-pay

## 2017-02-17 DIAGNOSIS — O99820 Streptococcus B carrier state complicating pregnancy: Secondary | ICD-10-CM

## 2017-02-17 DIAGNOSIS — O99824 Streptococcus B carrier state complicating childbirth: Secondary | ICD-10-CM | POA: Diagnosis present

## 2017-02-17 DIAGNOSIS — Z3493 Encounter for supervision of normal pregnancy, unspecified, third trimester: Secondary | ICD-10-CM

## 2017-02-17 DIAGNOSIS — O480 Post-term pregnancy: Principal | ICD-10-CM | POA: Diagnosis present

## 2017-02-17 DIAGNOSIS — Z3A41 41 weeks gestation of pregnancy: Secondary | ICD-10-CM

## 2017-02-17 LAB — TYPE AND SCREEN
ABO/RH(D): A POS
ANTIBODY SCREEN: NEGATIVE

## 2017-02-17 LAB — CBC
HEMATOCRIT: 31.1 % — AB (ref 36.0–46.0)
Hemoglobin: 11.1 g/dL — ABNORMAL LOW (ref 12.0–15.0)
MCH: 32.1 pg (ref 26.0–34.0)
MCHC: 35.7 g/dL (ref 30.0–36.0)
MCV: 89.9 fL (ref 78.0–100.0)
Platelets: 225 10*3/uL (ref 150–400)
RBC: 3.46 MIL/uL — ABNORMAL LOW (ref 3.87–5.11)
RDW: 14.4 % (ref 11.5–15.5)
WBC: 7.1 10*3/uL (ref 4.0–10.5)

## 2017-02-17 LAB — ABO/RH: ABO/RH(D): A POS

## 2017-02-17 LAB — RPR: RPR Ser Ql: NONREACTIVE

## 2017-02-17 LAB — OB RESULTS CONSOLE GBS: GBS: POSITIVE

## 2017-02-17 MED ORDER — OXYTOCIN 40 UNITS IN LACTATED RINGERS INFUSION - SIMPLE MED
1.0000 m[IU]/min | INTRAVENOUS | Status: DC
  Administered 2017-02-17: 2 m[IU]/min via INTRAVENOUS
  Filled 2017-02-17: qty 1000

## 2017-02-17 MED ORDER — ACETAMINOPHEN 325 MG PO TABS: 650.0000 mg | ORAL_TABLET | ORAL | Status: DC | PRN

## 2017-02-17 MED ORDER — LACTATED RINGERS IV SOLN
INTRAVENOUS | Status: DC
  Administered 2017-02-17 (×3): via INTRAVENOUS

## 2017-02-17 MED ORDER — OXYCODONE-ACETAMINOPHEN 5-325 MG PO TABS: 2.0000 | ORAL_TABLET | ORAL | Status: DC | PRN

## 2017-02-17 MED ORDER — LACTATED RINGERS IV SOLN
500.0000 mL | INTRAVENOUS | Status: DC | PRN
Start: 2017-02-17 — End: 2017-02-18

## 2017-02-17 MED ORDER — MISOPROSTOL 25 MCG QUARTER TABLET
25.0000 ug | ORAL_TABLET | ORAL | Status: DC | PRN
  Administered 2017-02-17: 25 ug via VAGINAL
  Filled 2017-02-17: qty 1

## 2017-02-17 MED ORDER — PENICILLIN G POTASSIUM 5000000 UNITS IJ SOLR
5.0000 10*6.[IU] | Freq: Once | INTRAVENOUS | Status: AC
  Administered 2017-02-17: 5 10*6.[IU] via INTRAVENOUS
  Filled 2017-02-17: qty 5

## 2017-02-17 MED ORDER — MISOPROSTOL 200 MCG PO TABS
50.0000 ug | ORAL_TABLET | ORAL | Status: DC | PRN
  Administered 2017-02-17: 50 ug via BUCCAL
  Filled 2017-02-17: qty 1

## 2017-02-17 MED ORDER — TERBUTALINE SULFATE 1 MG/ML IJ SOLN
0.2500 mg | Freq: Once | INTRAMUSCULAR | Status: DC | PRN
  Filled 2017-02-17: qty 1

## 2017-02-17 MED ORDER — SOD CITRATE-CITRIC ACID 500-334 MG/5ML PO SOLN: 30.0000 mL | ORAL | Status: DC | PRN

## 2017-02-17 MED ORDER — LIDOCAINE HCL (PF) 1 % IJ SOLN
30.0000 mL | INTRAMUSCULAR | Status: DC | PRN
  Filled 2017-02-17: qty 30

## 2017-02-17 MED ORDER — ONDANSETRON HCL 4 MG/2ML IJ SOLN: 4.0000 mg | Freq: Four times a day (QID) | INTRAMUSCULAR | Status: DC | PRN

## 2017-02-17 MED ORDER — OXYCODONE-ACETAMINOPHEN 5-325 MG PO TABS: 1.0000 | ORAL_TABLET | ORAL | Status: DC | PRN

## 2017-02-17 MED ORDER — PENICILLIN G POT IN DEXTROSE 60000 UNIT/ML IV SOLN
3.0000 10*6.[IU] | INTRAVENOUS | Status: DC
  Administered 2017-02-17 (×4): 3 10*6.[IU] via INTRAVENOUS
  Filled 2017-02-17 (×7): qty 50

## 2017-02-17 MED ORDER — OXYTOCIN 40 UNITS IN LACTATED RINGERS INFUSION - SIMPLE MED: 2.5000 [IU]/h | INTRAVENOUS | Status: DC

## 2017-02-17 MED ORDER — OXYTOCIN BOLUS FROM INFUSION
500.0000 mL | Freq: Once | INTRAVENOUS | Status: AC
  Administered 2017-02-18: 500 mL via INTRAVENOUS

## 2017-02-17 MED ORDER — FENTANYL CITRATE (PF) 100 MCG/2ML IJ SOLN
100.0000 ug | INTRAMUSCULAR | Status: DC | PRN
  Administered 2017-02-17 (×3): 100 ug via INTRAVENOUS
  Filled 2017-02-17 (×3): qty 2

## 2017-02-17 MED ORDER — TERBUTALINE SULFATE 1 MG/ML IJ SOLN
0.2500 mg | Freq: Once | INTRAMUSCULAR | Status: DC | PRN
Start: 2017-02-17 — End: 2017-02-18
  Filled 2017-02-17: qty 1

## 2017-02-17 NOTE — Progress Notes (Signed)
Labor Progress Note  Sheila [name] is a 31 y.o. G2P1001 at 5133w0d  admitted for induction of labor due to Post dates.  S: Doing well without concerns. Feeling her contractions more but does not need pain medication.   O:  BP 113/75   Pulse 91   Temp 98.5 F (36.9 C) (Oral)   Resp 18   Ht 5\' 6"  (1.676 m)   Wt 77.6 kg (171 lb)   LMP 05/06/2016   BMI 27.60 kg/m   No intake/output data recorded.  FHT:  FHR: 140 bpm, variability: moderate,  accelerations:  Present,  decelerations:  Absent UC:   regular, every 1-4 minutes SVE:   Dilation: 1.5 Effacement (%): Thick Station: -2 Exam by:: Dr. Doroteo GlassmanPhelps  Labs: Lab Results  Component Value Date   WBC 7.1 02/17/2017   HGB 11.1 (L) 02/17/2017   HCT 31.1 (L) 02/17/2017   MCV 89.9 02/17/2017   PLT 225 02/17/2017    Assessment / Plan: 31 y.o. G2P1001 6033w0d. Not in labor.  Induction of labor due to postterm  Labor: Progressing normally, continue cytotec. FB has been placed.  Fetal Wellbeing:  Category I Pain Control:  Labor support without medications Anticipated MOD:  NSVD  Expectant management   Sheila AdaJazma Phelps, DO OB Fellow

## 2017-02-17 NOTE — Anesthesia Pain Management Evaluation Note (Signed)
  CRNA Pain Management Visit Note  Patient: Sheila [name], 31 y.o., female  "Hello I am a [facility] the anesthesia [facility]. We have an anesthesia team available at all times to provide care throughout the hospital, including epidural management and anesthesia for C-section. I don't know your plan for the delivery whether it a natural birth, water birth, IV sedation, nitrous supplementation, doula or epidural, but we want to meet your pain goals."   1.Was your pain managed to your expectations on prior hospitalizations?   Yes   2.What is your expectation for pain management during this hospitalization?     Labor support without medications  3.How can we help you reach that goal?   Record the patient's initial score and the patient's pain goal.   Pain: 3  Pain Goal: 10 [facility] wants you to be able to say your pain was always managed very well.  Sheila [name],Sheila [name] 02/17/2017

## 2017-02-17 NOTE — Progress Notes (Signed)
Labor Progress Note Sheila [name] is a 31 y.o. G2P1001 at 8286w0d presented for IOL 2/2 postdates. S: Patient is considering epidural  O:  BP 131/74   Pulse 86   Temp 98.5 F (36.9 C) (Oral)   Resp 16   Ht 5\' 6"  (1.676 m)   Wt 171 lb (77.6 kg)   LMP 05/06/2016   BMI 27.60 kg/m  EFM: 150 bpm/mod var/pos acels/variable decels  CVE: Dilation: 7 Effacement (%): 70 Cervical Position: Middle Station: -2 Presentation: Vertex Exam by::  Sheila [name](Omega Slager, MD)   A&P: 31 y.o. G2P1001 5786w0d here for IOL 2/2 postdates #Labor: Progressing well. AROM @2320  with clear fluid. IUPC placed. Continue pitocin for adequate contraction pattern. Anticipate SVD. #Pain: planning for epidural #FWB: cat 2   Rolm BookbinderAmber Carinne Brandenburger, DO 11:23 PM

## 2017-02-17 NOTE — Progress Notes (Signed)
Sheila [name] is a 31 y.o. G2P1001 at 5166w0d by LMP admitted for induction of labor due to Post dates.  Subjective: Patient is doing well. She affirms some spotting of blood after Foley Bulb came out, but it has minimized. Nursing states some bloody show was observed at 1600 (5-10 cc). Patient is ambulating.  Objective: BP 120/78   Pulse 78   Temp 98.4 F (36.9 C) (Oral)   Resp 18   Ht 5\' 6"  (1.676 m)   Wt 77.6 kg (171 lb)   LMP 05/06/2016   BMI 27.60 kg/m  No intake/output data recorded. No intake/output data recorded.  FHT:  FHR: 140 bpm, variability: moderate,  accelerations:  Present,  decelerations:  Absent UC:   regular, every 3-5 minutes SVE:   Dilation: 5 Effacement (%): Thick Station: -2 Exam by:: Clare CharonKristin Hutchison, RN  Labs: Lab Results  Component Value Date   WBC 7.1 02/17/2017   HGB 11.1 (L) 02/17/2017   HCT 31.1 (L) 02/17/2017   MCV 89.9 02/17/2017   PLT 225 02/17/2017    Assessment / Plan: Induction of labor due to postterm,  progressing well on pitocin  Labor: Progressing normally Preeclampsia:  No signs or symptoms Fetal Wellbeing:  Category I Pain Control:  Labor support without medications I/D:  GBS Positive Anticipated MOD:  NSVD  Huel CoteBrian W Olia Hinderliter 02/17/2017, 6:18 PM
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
## 2017-02-18 ENCOUNTER — Inpatient Hospital Stay (HOSPITAL_COMMUNITY): Payer: Medicaid Other | Admitting: Anesthesiology

## 2017-02-18 ENCOUNTER — Encounter (HOSPITAL_COMMUNITY): Payer: Self-pay

## 2017-02-18 DIAGNOSIS — O99824 Streptococcus B carrier state complicating childbirth: Secondary | ICD-10-CM

## 2017-02-18 DIAGNOSIS — O480 Post-term pregnancy: Secondary | ICD-10-CM

## 2017-02-18 DIAGNOSIS — Z3A41 41 weeks gestation of pregnancy: Secondary | ICD-10-CM

## 2017-02-18 MED ORDER — DIBUCAINE 1 % RE OINT: 1.0000 "application " | TOPICAL_OINTMENT | RECTAL | Status: DC | PRN

## 2017-02-18 MED ORDER — DIPHENHYDRAMINE HCL 50 MG/ML IJ SOLN: 12.5000 mg | INTRAMUSCULAR | Status: DC | PRN

## 2017-02-18 MED ORDER — ACETAMINOPHEN 325 MG PO TABS
650.0000 mg | ORAL_TABLET | ORAL | Status: DC | PRN
  Administered 2017-02-18 (×2): 650 mg via ORAL
  Filled 2017-02-18 (×2): qty 2

## 2017-02-18 MED ORDER — FENTANYL 2.5 MCG/ML BUPIVACAINE 1/10 % EPIDURAL INFUSION (WH - ANES)
14.0000 mL/h | INTRAMUSCULAR | Status: DC | PRN
  Administered 2017-02-18: 14 mL/h via EPIDURAL
  Filled 2017-02-18: qty 100

## 2017-02-18 MED ORDER — BENZOCAINE-MENTHOL 20-0.5 % EX AERO
1.0000 "application " | INHALATION_SPRAY | CUTANEOUS | Status: DC | PRN
  Administered 2017-02-18: 1 via TOPICAL
  Filled 2017-02-18: qty 56

## 2017-02-18 MED ORDER — COCONUT OIL OIL: 1.0000 "application " | TOPICAL_OIL | Status: DC | PRN

## 2017-02-18 MED ORDER — PHENYLEPHRINE 40 MCG/ML (10ML) SYRINGE FOR IV PUSH (FOR BLOOD PRESSURE SUPPORT)
80.0000 ug | PREFILLED_SYRINGE | INTRAVENOUS | Status: DC | PRN
  Filled 2017-02-18: qty 10
  Filled 2017-02-18: qty 5

## 2017-02-18 MED ORDER — ZOLPIDEM TARTRATE 5 MG PO TABS: 5.0000 mg | ORAL_TABLET | Freq: Every evening | ORAL | Status: DC | PRN

## 2017-02-18 MED ORDER — EPHEDRINE 5 MG/ML INJ
10.0000 mg | INTRAVENOUS | Status: DC | PRN
  Filled 2017-02-18: qty 2

## 2017-02-18 MED ORDER — SENNOSIDES-DOCUSATE SODIUM 8.6-50 MG PO TABS
2.0000 | ORAL_TABLET | ORAL | Status: DC
  Administered 2017-02-18: 2 via ORAL

## 2017-02-18 MED ORDER — LIDOCAINE HCL (PF) 1 % IJ SOLN
INTRAMUSCULAR | Status: DC | PRN
  Administered 2017-02-18: 13 mL via EPIDURAL

## 2017-02-18 MED ORDER — ONDANSETRON HCL 4 MG PO TABS: 4.0000 mg | ORAL_TABLET | ORAL | Status: DC | PRN

## 2017-02-18 MED ORDER — SIMETHICONE 80 MG PO CHEW: 80.0000 mg | CHEWABLE_TABLET | ORAL | Status: DC | PRN

## 2017-02-18 MED ORDER — IBUPROFEN 600 MG PO TABS
600.0000 mg | ORAL_TABLET | Freq: Four times a day (QID) | ORAL | Status: DC
  Administered 2017-02-18 – 2017-02-19 (×6): 600 mg via ORAL
  Filled 2017-02-18 (×5): qty 1

## 2017-02-18 MED ORDER — LACTATED RINGERS IV SOLN: 500.0000 mL | Freq: Once | INTRAVENOUS | Status: DC

## 2017-02-18 MED ORDER — PHENYLEPHRINE 40 MCG/ML (10ML) SYRINGE FOR IV PUSH (FOR BLOOD PRESSURE SUPPORT)
80.0000 ug | PREFILLED_SYRINGE | INTRAVENOUS | Status: DC | PRN
  Filled 2017-02-18: qty 5

## 2017-02-18 MED ORDER — TETANUS-DIPHTH-ACELL PERTUSSIS 5-2.5-18.5 LF-MCG/0.5 IM SUSP: 0.5000 mL | Freq: Once | INTRAMUSCULAR | Status: DC

## 2017-02-18 MED ORDER — WITCH HAZEL-GLYCERIN EX PADS: 1.0000 "application " | MEDICATED_PAD | CUTANEOUS | Status: DC | PRN

## 2017-02-18 MED ORDER — ONDANSETRON HCL 4 MG/2ML IJ SOLN: 4.0000 mg | INTRAMUSCULAR | Status: DC | PRN

## 2017-02-18 MED ORDER — PRENATAL MULTIVITAMIN CH
1.0000 | ORAL_TABLET | Freq: Every day | ORAL | Status: DC
  Administered 2017-02-18 – 2017-02-19 (×2): 1 via ORAL
  Filled 2017-02-18 (×2): qty 1

## 2017-02-18 MED ORDER — DIPHENHYDRAMINE HCL 25 MG PO CAPS: 25.0000 mg | ORAL_CAPSULE | Freq: Four times a day (QID) | ORAL | Status: DC | PRN

## 2017-02-18 NOTE — Anesthesia Preprocedure Evaluation (Signed)
Anesthesia Evaluation  Patient identified by MRN, date of birth, ID band Patient awake    Reviewed: Allergy & Precautions, NPO status , Patient's Chart, lab work & pertinent test results  Airway Mallampati: II  TM Distance: >3 FB Neck ROM: Full    Dental no notable dental hx.    Pulmonary neg pulmonary ROS, asthma ,    Pulmonary exam normal breath sounds clear to auscultation       Cardiovascular negative cardio ROS Normal cardiovascular exam Rhythm:Regular Rate:Normal     Neuro/Psych negative neurological ROS  negative psych ROS   GI/Hepatic negative GI ROS, Neg liver ROS,   Endo/Other  negative endocrine ROS  Renal/GU negative Renal ROS  negative genitourinary   Musculoskeletal negative musculoskeletal ROS (+)   Abdominal   Peds negative pediatric ROS (+)  Hematology negative hematology ROS (+)   Anesthesia Other Findings   Reproductive/Obstetrics negative OB ROS (+) Pregnancy                             Anesthesia Physical Anesthesia Plan  ASA: II  Anesthesia Plan: Epidural   Post-op Pain Management:    Induction:   PONV Risk Score and Plan:   Airway Management Planned:   Additional Equipment:   Intra-op Plan:   Post-operative Plan:   Informed Consent: I have reviewed the patients History and Physical, chart, labs and discussed the procedure including the risks, benefits and alternatives for the proposed anesthesia with the patient or authorized representative who has indicated his/her understanding and acceptance.     Plan Discussed with:   Anesthesia Plan Comments:         Anesthesia Quick Evaluation

## 2017-02-18 NOTE — Progress Notes (Signed)
CSW acknowledges consult for "[employer]mom," which does not meet criteria for an automatic CSW consult.  Please consult CSW if current concerns arise, by MOB's request, or if MOB scores a 10 or greater/yes to question 10 on the Edinburgh Postnatal Depression Scale.  Please consult Financial Counselor/[place]. [place] if MOB has questions about Medicaid.   

## 2017-02-18 NOTE — Anesthesia Postprocedure Evaluation (Signed)
Anesthesia Post Note  Patient: Sheila [name]  Procedure(s) Performed: AN AD HOC LABOR EPIDURAL     Patient location during evaluation: Mother Baby Anesthesia Type: Epidural Level of consciousness: awake Pain management: satisfactory to patient Vital Signs Assessment: post-procedure vital signs reviewed and stable Respiratory status: spontaneous breathing Cardiovascular status: stable Anesthetic complications: no    Last Vitals:  Vitals:   02/18/17 0552 02/18/17 0945  BP: 131/65 128/68  Pulse: 78 75  Resp: 18 18  Temp: 36.9 C 36.9 C  SpO2:      Last Pain:  Vitals:   02/18/17 1550  TempSrc:   PainSc: 8    Pain Goal: Patients Stated Pain Goal: 0 (02/18/17 0945)               Cephus ShellingBURGER,Kedarius Aloisi

## 2017-02-18 NOTE — Anesthesia Procedure Notes (Signed)
Epidural Patient location during procedure: OB Start time: 02/18/2017 12:39 AM End time: 02/18/2017 12:46 AM  Staffing Anesthesiologist: Anitra LauthMILLER, Devonia Farro RAY Performed: anesthesiologist   Preanesthetic Checklist Completed: patient identified, site marked, surgical consent, pre-op evaluation, timeout performed, IV checked, risks and benefits discussed and monitors and equipment checked  Epidural Patient position: sitting Prep: ChloraPrep Patient monitoring: heart rate, cardiac monitor, continuous pulse ox and blood pressure Approach: midline Location: L2-L3 Injection technique: LOR saline  Needle:  Needle type: Tuohy  Needle gauge: 17 G Needle length: 9 cm Needle insertion depth: 4 cm Catheter type: closed end flexible Catheter size: 20 Guage Catheter at skin depth: 7 cm Test dose: negative  Assessment Events: blood not aspirated, injection not painful, no injection resistance, negative IV test and no paresthesia  Additional Notes Reason for block:procedure for pain

## 2017-02-18 NOTE — Progress Notes (Signed)
During assessment, MOB was asked if she needed an interpreter for this shift for education teaching.  MOB refused and stated that "she does not need it".

## 2017-02-19 MED ORDER — IBUPROFEN 600 MG PO TABS: 600.0000 mg | ORAL_TABLET | Freq: Four times a day (QID) | ORAL | 1 refills | Status: DC | PRN

## 2017-02-19 MED ORDER — SENNOSIDES-DOCUSATE SODIUM 8.6-50 MG PO TABS: 2.0000 | ORAL_TABLET | Freq: Every evening | ORAL | 0 refills | Status: DC | PRN

## 2017-02-19 NOTE — Discharge Summary (Signed)
OB Discharge Summary     Patient Name: Sheila PartyFatouma [name] DOB: September 21, 1985 MRN: [id number]  Date of admission: 02/17/2017 Delivering MD: Rolm BookbinderMOSS, AMBER   Date of discharge: 02/19/2017  Admitting diagnosis: INDUCTION Intrauterine pregnancy: 9119w1d     Secondary diagnosis:  Active Problems:   SVD (spontaneous vaginal delivery)  Additional problems: ASCUS with negative HPV test result     Discharge diagnosis: Term Pregnancy Delivered                                                                                                Post partum procedures:n/a  Augmentation: Pitocin  Complications: None  Hospital course:  Induction of Labor With Vaginal Delivery   31 y.o. yo G2P2002 at 6919w1d was admitted to the hospital 02/17/2017 for induction of labor.  Indication for induction: Postdates.  Patient had an uncomplicated labor course as follows: Membrane Rupture Time/Date: 10:21 PM ,02/17/2017   Intrapartum Procedures: Episiotomy: None [1]                                         Lacerations:  1st degree [2]  Patient had delivery of a Viable infant.  Information for the patient's newborn:  Enid BaasMounkaila, Girl Abygale [[id number]][[id number]]      02/18/2017  Details of delivery can be found in separate delivery note.  Patient had a routine postpartum course. Patient is discharged home 02/19/17.  Physical exam  Vitals:   02/18/17 0552 02/18/17 0945 02/18/17 1856 02/19/17 0601  BP: 131/65 128/68 125/84 115/61  Pulse: 78 75 66 78  Resp: 18 18 18    Temp: 98.4 F (36.9 C) 98.5 F (36.9 C) 98.1 F (36.7 C)   TempSrc: Oral Oral Oral   SpO2:      Weight:      Height:       General: alert, cooperative and no distress Lochia: appropriate Uterine Fundus: firm Incision: N/A DVT Evaluation: No evidence of DVT seen on physical exam. No cords or calf tenderness. Labs: Lab Results  Component Value Date   WBC 7.1 02/17/2017   HGB 11.1 (L) 02/17/2017   HCT 31.1 (L) 02/17/2017   MCV 89.9 02/17/2017   PLT 225 02/17/2017   CMP Latest Ref Rng & Units 03/03/2016  Glucose 65 - 99 mg/dL 75  BUN 6 - 20 mg/dL 9  Creatinine 8.650.44 - 7.841.00 mg/dL 6.960.67  Sodium 295135 - 284145 mmol/L 137  Potassium 3.5 - 5.1 mmol/L 3.9  Chloride 101 - 111 mmol/L 108  CO2 22 - 32 mmol/L 19(L)  Calcium 8.9 - 10.3 mg/dL 9.5  Total Protein 6.5 - 8.1 g/dL 7.9  Total Bilirubin 0.3 - 1.2 mg/dL 0.7  Alkaline Phos 38 - 126 U/L 69  AST 15 - 41 U/L 20  ALT 14 - 54 U/L 10(L)    Discharge instruction: per After Visit Summary and "Baby and Me Booklet".  After visit meds:  Allergies as of 02/19/2017   No Known Allergies     Medication List  TAKE these medications   ibuprofen 600 MG tablet Commonly known as:  ADVIL,MOTRIN Take 1 tablet (600 mg total) by mouth every 6 (six) hours as needed.   PRENATAL VITAMINS PO Take 1 tablet by mouth daily.   senna-docusate 8.6-50 MG tablet Commonly known as:  Senokot-S Take 2 tablets by mouth at bedtime as needed for mild constipation.       Diet: routine diet  Activity: Advance as tolerated. Pelvic rest for 6 weeks.   Outpatient follow up:4 weeks Follow up Appt:No future appointments. Follow up Visit:No Follow-up on file.  Postpartum contraception: Progesterone only pills  Newborn Data: Live born female  Birth Weight: 7 lb 1.6 oz (3221 g) APGAR: 8, 9  Newborn Delivery   Birth date/time:  02/18/2017 02:49:00 Delivery type:  Vaginal, Spontaneous Delivery      Baby Feeding: Breast Disposition:home with mother   02/19/2017 Arlyce Harman, DO

## 2017-02-19 NOTE — Lactation Note (Signed)
Lactation Consultation Note  Patient Name: Sheila PartyFatouma [name] ZOXWR'UToday's Date: 02/19/2017 Reason for consult: Initial assessment;Infant weight loss  Baby is 3233 hours old and has been to the breast and formula per moms preference.  Baby hungry when LC walked in the room, [facility]  Assisted with latching in the cross cradle ,  Depth obtained , still feeding at 10 mins with multiple swallows, increased with breast compressions.  Sore nipple and engorgement prevention and tx reviewed , LC instructed mom on the use of hand pump.  And increased flange for when milk comes in.  Mother informed of post-discharge support and given phone number to the lactation department, including services for phone call assistance; out-patient appointments; and breastfeeding support group. List of other breastfeeding resources in the community given in the handout. Encouraged mother to call for problems or concerns related to breastfeeding.   Maternal Data Has patient been taught Hand Expression?: Yes Does the patient have breastfeeding experience prior to this delivery?: No  Feeding Feeding Type: Breast Fed Length of feed:  (feeding 10 mins, and still feeding , multiple swallows )  LATCH Score Latch: Grasps breast easily, tongue down, lips flanged, rhythmical sucking.  Audible Swallowing: Spontaneous and intermittent  Type of Nipple: Everted at rest and after stimulation  Comfort (Breast/Nipple): Filling, red/small blisters or bruises, mild/mod discomfort  Hold (Positioning): Assistance needed to correctly position infant at breast and maintain latch.  LATCH Score: 8  Interventions Interventions: Breast feeding basics reviewed;Assisted with latch;Breast massage;Hand express;Breast compression;Adjust position;Support pillows;Position options;Hand pump  Lactation Tools Discussed/Used Tools: Pump;Flanges Flange Size: 27;24 Breast pump type: Manual Pump Review: Setup, frequency, and cleaning;Milk  Storage Initiated by:: MAI  Date initiated:: 02/19/17   Consult Status Consult Status: Complete Date: 02/19/17    Sheila GreathouseMargaret Ann Refujio [name] 02/19/2017, 3:47 PM

## 2017-02-19 NOTE — Progress Notes (Signed)
Labor Progress Note Sheila PartyFatouma [name] is a 31 y.o. G2P2002 at 6565w1d admitted for IOL 2/2 postdates. Pt is PPD #1.  S: Pt is up doing her school-age daughter hair. Denies heavy bleeding but reports passing a "fist-sized" clot. Denies any headache, dizziness, blurry vision, abd pain or leg edema.   O:  BP 115/61   Pulse 78   Temp 98.1 F (36.7 C) (Oral)   Resp 18   Ht 5\' 6"  (1.676 m)   Wt 77.6 kg (171 lb)   LMP 05/06/2016   SpO2 98%   Breastfeeding? Unknown   BMI 27.60 kg/m   CVE: Dilation: 10 Dilation Complete Date: 02/18/17 Dilation Complete Time: 0235 Effacement (%): 80 Cervical Position: Middle Station: +2 Presentation: Vertex Exam by:: Sheila GasmanKathy [name], Sheila [name]   A&P: 31 y.o. [id number] 965w1d admitted for IOL 2/2 postdates. Pt delivered at 0249 yesterday. Has not had any major complications. Plans to bottle-feed baby. Wants to get depo for postpartum contraception. Reports that baby is not eating. Ready to go home once baby is cleared. #Pain: No pain #GBS positive treated with PCN  Sheila RainwaterProsper M Jamarques [name], Medical Student 7:00 AM

## 2017-02-19 NOTE — Discharge Instructions (Signed)

## 2017-04-02 ENCOUNTER — Ambulatory Visit: Payer: Self-pay | Admitting: Medical

## 2017-04-28 ENCOUNTER — Ambulatory Visit (INDEPENDENT_AMBULATORY_CARE_PROVIDER_SITE_OTHER): Payer: Self-pay | Admitting: Advanced Practice Midwife

## 2017-04-28 DIAGNOSIS — Z3202 Encounter for pregnancy test, result negative: Secondary | ICD-10-CM

## 2017-04-28 LAB — POCT PREGNANCY, URINE: PREG TEST UR: NEGATIVE

## 2017-04-28 MED ORDER — NORETHINDRONE 0.35 MG PO TABS: 1.0000 | ORAL_TABLET | Freq: Every day | ORAL | 11 refills | Status: DC

## 2017-04-28 NOTE — Progress Notes (Signed)
Subjective:     Sheila [name] is a 32 y.o. female who presents for a postpartum visit. She is 9 weeks postpartum following a spontaneous vaginal delivery. I have fully reviewed the prenatal and intrapartum course. The delivery was at 41 gestational weeks. Outcome: spontaneous vaginal delivery. Anesthesia: epidural. Postpartum course has been uncomplicated. Baby's course has been uncomplicated. Baby is feeding by both breast and bottle - gerber . Bleeding no bleeding. Bowel function is normal. Bladder function is normal. Patient is sexually active. Contraception method is pills. Postpartum depression screening: negative.  The following portions of the patient's history were reviewed and updated as appropriate: allergies, current medications, past family history, past medical history, past social history, past surgical history and problem list.  Review of Systems Pertinent items are noted in HPI.   Objective:    LMP 05/06/2016   General:  alert and cooperative   Breasts:  inspection negative, no nipple discharge or bleeding, no masses or nodularity palpable  Lungs: clear to auscultation bilaterally  Heart:  regular rate and rhythm  Abdomen: soft, non-tender; bowel sounds normal; no masses,  no organomegaly   Vulva:  normal  Vagina: not evaluated  Cervix:  not examined   Corpus: not examined  Adnexa:  not evaluated  Rectal Exam: Not performed.        Assessment:     Normal postpartum exam. Pap smear not done at today's visit.   Plan:    1. Contraception: oral progesterone-only contraceptive 2. Routine care 3. Follow up in: 1 year or as needed.

## 2019-02-15 ENCOUNTER — Other Ambulatory Visit: Payer: Self-pay

## 2019-02-15 ENCOUNTER — Telehealth: Payer: Self-pay | Admitting: Family Medicine

## 2019-02-15 ENCOUNTER — Ambulatory Visit: Payer: Medicaid Other | Admitting: *Deleted

## 2019-02-15 DIAGNOSIS — Z3490 Encounter for supervision of normal pregnancy, unspecified, unspecified trimester: Secondary | ICD-10-CM

## 2019-02-15 NOTE — Progress Notes (Signed)
9:37 I called [facility]le for her telephone visit and left a message I am calling for your telephone visit - I will call again in a few minutes; please be available by phone. Orella Cushman,RN 9:40 I called Ronia and a female answered . He said she is not there but has 2 numbers. I asked him for her other number and he gave me number [phone]  . I called that number and heard a message the voicemail is not set up.  Aulton Routt,RN 9:45 I called [phone] and again heard message voicemail box not set up. Will leave message for registrars to reschedule.  Clemence Stillings,RN

## 2019-02-15 NOTE — Telephone Encounter (Signed)
Nurse Vaughan Basta sent a private message stating she could not locate the patient pregnancy confirmation. Informed the nurse the document was placed in the labs section to be filed with the patient chart. Nurse Vaughan Basta requested that I reach out to [facility] to have them fax a copy of the pregnancy test. The department was called however I was transferred to an option to leave voicemail for a call back. Notified the nurse of the information provided.

## 2019-02-17 ENCOUNTER — Encounter: Payer: Self-pay | Admitting: Obstetrics and Gynecology

## 2019-02-17 ENCOUNTER — Encounter: Payer: Self-pay | Admitting: *Deleted

## 2019-02-25 ENCOUNTER — Other Ambulatory Visit: Payer: Self-pay

## 2019-02-25 ENCOUNTER — Ambulatory Visit (INDEPENDENT_AMBULATORY_CARE_PROVIDER_SITE_OTHER): Payer: Medicaid Other | Admitting: *Deleted

## 2019-02-25 DIAGNOSIS — N898 Other specified noninflammatory disorders of vagina: Secondary | ICD-10-CM

## 2019-02-25 DIAGNOSIS — O09529 Supervision of elderly multigravida, unspecified trimester: Secondary | ICD-10-CM

## 2019-02-25 DIAGNOSIS — J45909 Unspecified asthma, uncomplicated: Secondary | ICD-10-CM

## 2019-02-25 DIAGNOSIS — O0930 Supervision of pregnancy with insufficient antenatal care, unspecified trimester: Secondary | ICD-10-CM | POA: Insufficient documentation

## 2019-02-25 DIAGNOSIS — R87610 Atypical squamous cells of undetermined significance on cytologic smear of cervix (ASC-US): Secondary | ICD-10-CM

## 2019-02-25 DIAGNOSIS — O0990 Supervision of high risk pregnancy, unspecified, unspecified trimester: Secondary | ICD-10-CM | POA: Insufficient documentation

## 2019-02-25 MED ORDER — BLOOD PRESSURE KIT DEVI: 1.0000 | 0 refills | Status: DC | PRN

## 2019-02-25 MED ORDER — TERCONAZOLE 0.4 % VA CREA: 1.0000 | TOPICAL_CREAM | Freq: Every day | VAGINAL | 0 refills | Status: AC

## 2019-02-25 NOTE — Progress Notes (Signed)
I connected with  Reine Just on 02/25/19 at  2:30 PM EST by telephone and verified that I am speaking with the correct person using two identifiers.   I discussed the limitations, risks, security and privacy concerns of performing an evaluation and management service by telephone and the availability of in person appointments. I also discussed with the patient that there may be a patient responsible charge related to this service. The patient expressed understanding and agreed to proceed. Kaitlyne  Confirmed she speaks [place] and [place] and does not need interpreter for [place]. I Explained I am completing her New OB Intake today. We discussed Her EDD is unclear . She states her LMP was sometime between 09/01/18 - 09/19/18. I explained we will schedule anatomy US because she is at least 25 weeks. I also offered genetic counseling due to her age which she agreed to. I scheduled for first available appointment around dates/ times patient requested.  I gave her the appointment.  I reviewed her allergies, meds, OB History, Medical /Surgical history, and appropriate screenings. I explained I will send her the [employer] app- app sent to her while on phone.  I explained we will send a blood pressure cuff to [facility] that will [facility] that prescription and they  will call her to verify her information. I asked her to bring the blood pressure cuff with her to her first ob appointment so we can show her how to use it. Explained  then we will have her take her blood pressure weekly and enter into the app. Explained she will have some visits in office and some virtually. I sent her the MyChart text and she downloaded the MyChart app. I reviewed her new ob  appointment date/ time with her , our location and to wear mask, no visitors. Explained she will have exam, ob bloodwork, hemoglobin a1C, cbg , genetic testing if desired, pap if needed.. She c/o vaginal ithing and requested medication. Per protocol ordered  cream. She voices understanding.   Tashay Bozich,RN 02/25/2019  2:32 PM

## 2019-02-25 NOTE — Progress Notes (Signed)
Patient seen and assessed by nursing staff during this encounter. I have reviewed the chart and agree with the documentation and plan.  Verita Schneiders, MD 02/25/2019 3:31 PM

## 2019-02-25 NOTE — Patient Instructions (Signed)

## 2019-03-02 ENCOUNTER — Telehealth: Payer: Self-pay | Admitting: Family Medicine

## 2019-03-02 NOTE — Telephone Encounter (Signed)
Called patient to let her know of her appointment change due to changes in the office, I was unable to leave a message because the mailbox is not set up.

## 2019-03-03 ENCOUNTER — Encounter: Payer: Self-pay | Admitting: Obstetrics & Gynecology

## 2019-03-03 ENCOUNTER — Telehealth: Payer: Self-pay | Admitting: Family Medicine

## 2019-03-03 NOTE — Telephone Encounter (Signed)
Patient came to the office on 11/11 for an appointment that was rescheduled. Patient was called and mychart message was sent on 11/10 about her appointment change due to office changes. Once I informed patient of this information she became very upset. Patient informed that we tried to contact her with the appointment change but was unable to be reached and we could not leave a voicemail because it was not setup. Patient stated that she is not always by her phone and cannot answer.Patient instructed that she should think about setting up her voicemail to prevent these type of things in the future. Patient stated that she is 6 months and this is the 3rd appointment that has been changed. After looking at the patient's chart she did not keep her previous appointments. Patient stated that she would like all of her appointments canceled with [place]. Appointments were canceled per her request.

## 2019-03-08 ENCOUNTER — Other Ambulatory Visit: Payer: Self-pay

## 2019-03-08 ENCOUNTER — Ambulatory Visit (INDEPENDENT_AMBULATORY_CARE_PROVIDER_SITE_OTHER): Payer: Medicaid Other | Admitting: Obstetrics and Gynecology

## 2019-03-08 ENCOUNTER — Ambulatory Visit (HOSPITAL_COMMUNITY): Payer: Medicaid Other

## 2019-03-08 ENCOUNTER — Other Ambulatory Visit (HOSPITAL_COMMUNITY)
Admission: RE | Admit: 2019-03-08 | Discharge: 2019-03-08 | Disposition: A | Discharge status: Home / Self Care | Payer: Medicaid Other | Source: Ambulatory Visit | Attending: Obstetrics and Gynecology | Admitting: Obstetrics and Gynecology

## 2019-03-08 ENCOUNTER — Encounter: Payer: Self-pay | Admitting: Obstetrics and Gynecology

## 2019-03-08 DIAGNOSIS — O0992 Supervision of high risk pregnancy, unspecified, second trimester: Secondary | ICD-10-CM

## 2019-03-08 DIAGNOSIS — Z3480 Encounter for supervision of other normal pregnancy, unspecified trimester: Secondary | ICD-10-CM | POA: Insufficient documentation

## 2019-03-08 DIAGNOSIS — Z3A26 26 weeks gestation of pregnancy: Secondary | ICD-10-CM

## 2019-03-08 DIAGNOSIS — O0990 Supervision of high risk pregnancy, unspecified, unspecified trimester: Secondary | ICD-10-CM

## 2019-03-08 MED ORDER — TERCONAZOLE 0.8 % VA CREA
1.0000 | TOPICAL_CREAM | Freq: Every day | VAGINAL | 0 refills | Status: DC
Start: 2019-03-08 — End: 2019-04-30

## 2019-03-08 MED ORDER — ASPIRIN EC 81 MG PO TBEC: 81.0000 mg | DELAYED_RELEASE_TABLET | Freq: Every day | ORAL | 2 refills | Status: DC

## 2019-03-08 MED ORDER — VITAFOL ULTRA 29-0.6-0.4-200 MG PO CAPS: 1.0000 | ORAL_CAPSULE | Freq: Every day | ORAL | 12 refills | Status: DC

## 2019-03-08 NOTE — Patient Instructions (Signed)
Third Trimester of Pregnancy The third trimester is from week 28 through week 40 (months 7 through 9). The third trimester is a time when the unborn baby (fetus) is growing rapidly. At the end of the ninth month, the fetus is about 20 inches in length and weighs 6-10 pounds. Body changes during your third trimester Your body will continue to go through many changes during pregnancy. The changes vary from woman to woman. During the third trimester:  Your weight will continue to increase. You can expect to gain 25-35 pounds (11-16 kg) by the end of the pregnancy.  You may begin to get stretch marks on your hips, abdomen, and breasts.  You may urinate more often because the fetus is moving lower into your pelvis and pressing on your bladder.  You may develop or continue to have heartburn. This is caused by increased hormones that slow down muscles in the digestive tract.  You may develop or continue to have constipation because increased hormones slow digestion and cause the muscles that push waste through your intestines to relax.  You may develop hemorrhoids. These are swollen veins (varicose veins) in the rectum that can itch or be painful.  You may develop swollen, bulging veins (varicose veins) in your legs.  You may have increased body aches in the pelvis, back, or thighs. This is due to weight gain and increased hormones that are relaxing your joints.  You may have changes in your hair. These can include thickening of your hair, rapid growth, and changes in texture. Some women also have hair loss during or after pregnancy, or hair that feels dry or thin. Your hair will most likely return to normal after your baby is born.  Your breasts will continue to grow and they will continue to become tender. A yellow fluid (colostrum) may leak from your breasts. This is the first milk you are producing for your baby.  Your belly button may stick out.  You may notice more swelling in your  hands, face, or ankles.  You may have increased tingling or numbness in your hands, arms, and legs. The skin on your belly may also feel numb.  You may feel short of breath because of your expanding uterus.  You may have more problems sleeping. This can be caused by the size of your belly, increased need to urinate, and an increase in your body's metabolism.  You may notice the fetus "dropping," or moving lower in your abdomen (lightening).  You may have increased vaginal discharge.  You may notice your joints feel loose and you may have pain around your pelvic bone. What to expect at prenatal visits You will have prenatal exams every 2 weeks until week 36. Then you will have weekly prenatal exams. During a routine prenatal visit:  You will be weighed to make sure you and the baby are growing normally.  Your blood pressure will be taken.  Your abdomen will be measured to track your baby's growth.  The fetal heartbeat will be listened to.  Any test results from the previous visit will be discussed.  You may have a cervical check near your due date to see if your cervix has softened or thinned (effaced).  You will be tested for Group B streptococcus. This happens between 35 and 37 weeks. Your health care provider may ask you:  What your birth plan is.  How you are feeling.  If you are feeling the baby move.  If you have had any  abnormal symptoms, such as leaking fluid, bleeding, severe headaches, or abdominal cramping.  If you are using any tobacco products, including cigarettes, chewing tobacco, and electronic cigarettes.  If you have any questions. Other tests or screenings that may be performed during your third trimester include:  Blood tests that check for low iron levels (anemia).  Fetal testing to check the health, activity level, and growth of the fetus. Testing is done if you have certain medical conditions or if there are problems during the  pregnancy.  Nonstress test (NST). This test checks the health of your baby to make sure there are no signs of problems, such as the baby not getting enough oxygen. During this test, a belt is placed around your belly. The baby is made to move, and its heart rate is monitored during movement. What is false labor? False labor is a condition in which you feel small, irregular tightenings of the muscles in the womb (contractions) that usually go away with rest, changing position, or drinking water. These are called Braxton Hicks contractions. Contractions may last for hours, days, or even weeks before true labor sets in. If contractions come at regular intervals, become more frequent, increase in intensity, or become painful, you should see your health care provider. What are the signs of labor?  Abdominal cramps.  Regular contractions that start at 10 minutes apart and become stronger and more frequent with time.  Contractions that start on the top of the uterus and spread down to the lower abdomen and back.  Increased pelvic pressure and dull back pain.  A watery or bloody mucus discharge that comes from the vagina.  Leaking of amniotic fluid. This is also known as your "water breaking." It could be a slow trickle or a gush. Let your health care provider know if it has a color or strange odor. If you have any of these signs, call your health care provider right away, even if it is before your due date. Follow these instructions at home: Medicines  Follow your health care provider's instructions regarding medicine use. Specific medicines may be either safe or unsafe to take during pregnancy.  Take a prenatal vitamin that contains at least 600 micrograms (mcg) of folic acid.  If you develop constipation, try taking a stool softener if your health care provider approves. Eating and drinking   Eat a balanced diet that includes fresh fruits and vegetables, whole grains, good sources of protein  such as meat, eggs, or tofu, and low-fat dairy. Your health care provider will help you determine the amount of weight gain that is right for you.  Avoid raw meat and uncooked cheese. These carry germs that can cause birth defects in the baby.  If you have low calcium intake from food, talk to your health care provider about whether you should take a daily calcium supplement.  Eat four or five small meals rather than three large meals a day.  Limit foods that are high in fat and processed sugars, such as fried and sweet foods.  To prevent constipation: ? Drink enough fluid to keep your urine clear or pale yellow. ? Eat foods that are high in fiber, such as fresh fruits and vegetables, whole grains, and beans. Activity  Exercise only as directed by your health care provider. Most women can continue their usual exercise routine during pregnancy. Try to exercise for 30 minutes at least 5 days a week. Stop exercising if you experience uterine contractions.  Avoid heavy lifting.  Do not exercise in extreme heat or humidity, or at high altitudes.  Wear low-heel, comfortable shoes.  Practice good posture.  You may continue to have sex unless your health care provider tells you otherwise. Relieving pain and discomfort  Take frequent breaks and rest with your legs elevated if you have leg cramps or low back pain.  Take warm sitz baths to soothe any pain or discomfort caused by hemorrhoids. Use hemorrhoid cream if your health care provider approves.  Wear a good support bra to prevent discomfort from breast tenderness.  If you develop varicose veins: ? Wear support pantyhose or compression stockings as told by your healthcare provider. ? Elevate your feet for 15 minutes, 3-4 times a day. Prenatal care  Write down your questions. Take them to your prenatal visits.  Keep all your prenatal visits as told by your health care provider. This is important. Safety  Wear your seat belt at  all times when driving.  Make a list of emergency phone numbers, including numbers for family, friends, the hospital, and police and fire departments. General instructions  Avoid cat litter boxes and soil used by cats. These carry germs that can cause birth defects in the baby. If you have a cat, ask someone to clean the litter box for you.  Do not travel far distances unless it is absolutely necessary and only with the approval of your health care provider.  Do not use hot tubs, steam rooms, or saunas.  Do not drink alcohol.  Do not use any products that contain nicotine or tobacco, such as cigarettes and e-cigarettes. If you need help quitting, ask your health care provider.  Do not use any medicinal herbs or unprescribed drugs. These chemicals affect the formation and growth of the baby.  Do not douche or use tampons or scented sanitary pads.  Do not cross your legs for long periods of time.  To prepare for the arrival of your baby: ? Take prenatal classes to understand, practice, and ask questions about labor and delivery. ? Make a trial run to the hospital. ? Visit the hospital and tour the maternity area. ? Arrange for maternity or paternity leave through employers. ? Arrange for family and friends to take care of pets while you are in the hospital. ? Purchase a rear-facing car seat and make sure you know how to install it in your car. ? Pack your hospital bag. ? Prepare the babys nursery. Make sure to remove all pillows and stuffed animals from the baby's crib to prevent suffocation.  Visit your dentist if you have not gone during your pregnancy. Use a soft toothbrush to brush your teeth and be gentle when you floss. Contact a health care provider if:  You are unsure if you are in labor or if your water has broken.  You become dizzy.  You have mild pelvic cramps, pelvic pressure, or nagging pain in your abdominal area.  You have lower back pain.  You have persistent  nausea, vomiting, or diarrhea.  You have an unusual or bad smelling vaginal discharge.  You have pain when you urinate. Get help right away if:  Your water breaks before 37 weeks.  You have regular contractions less than 5 minutes apart before 37 weeks.  You have a fever.  You are leaking fluid from your vagina.  You have spotting or bleeding from your vagina.  You have severe abdominal pain or cramping.  You have rapid weight loss or weight gain.  You  have shortness of breath with chest pain.  You notice sudden or extreme swelling of your face, hands, ankles, feet, or legs.  Your baby makes fewer than 10 movements in 2 hours.  You have severe headaches that do not go away when you take medicine.  You have vision changes. Summary  The third trimester is from week 28 through week 40, months 7 through 9. The third trimester is a time when the unborn baby (fetus) is growing rapidly.  During the third trimester, your discomfort may increase as you and your baby continue to gain weight. You may have abdominal, leg, and back pain, sleeping problems, and an increased need to urinate.  During the third trimester your breasts will keep growing and they will continue to become tender. A yellow fluid (colostrum) may leak from your breasts. This is the first milk you are producing for your baby.  False labor is a condition in which you feel small, irregular tightenings of the muscles in the womb (contractions) that eventually go away. These are called Braxton Hicks contractions. Contractions may last for hours, days, or even weeks before true labor sets in.  Signs of labor can include: abdominal cramps; regular contractions that start at 10 minutes apart and become stronger and more frequent with time; watery or bloody mucus discharge that comes from the vagina; increased pelvic pressure and dull back pain; and leaking of amniotic fluid. This information is not intended to replace advice  given to you by your health care provider. Make sure you discuss any questions you have with your health care provider. Document Released: 04/02/2001 Document Revised: 07/30/2018 Document Reviewed: 05/14/2016 Elsevier Patient Education  2020 [employer].   Contraception Choices Contraception, also called birth control, refers to methods or devices that prevent pregnancy. Hormonal methods Contraceptive implant  A contraceptive implant is a thin, plastic tube that contains a hormone. It is inserted into the upper part of the arm. It can remain in place for up to 3 years. Progestin-only injections Progestin-only injections are injections of progestin, a synthetic form of the hormone progesterone. They are given every 3 months by a health care provider. Birth control pills  Birth control pills are pills that contain hormones that prevent pregnancy. They must be taken once a day, preferably at the same time each day. Birth control patch  The birth control patch contains hormones that prevent pregnancy. It is placed on the skin and must be changed once a week for three weeks and removed on the fourth week. A prescription is needed to use this method of contraception. Vaginal ring  A vaginal ring contains hormones that prevent pregnancy. It is placed in the vagina for three weeks and removed on the fourth week. After that, the process is repeated with a new ring. A prescription is needed to use this method of contraception. Emergency contraceptive Emergency contraceptives prevent pregnancy after unprotected sex. They come in pill form and can be taken up to 5 days after sex. They work best the sooner they are taken after having sex. Most emergency contraceptives are available without a prescription. This method should not be used as your only form of birth control. Barrier methods Female condom  A female condom is a thin sheath that is worn over the penis during sex. Condoms keep sperm from going  inside a woman's body. They can be used with a spermicide to increase their effectiveness. They should be disposed after a single use. Female condom  A female condom  is a soft, loose-fitting sheath that is put into the vagina before sex. The condom keeps sperm from going inside a woman's body. They should be disposed after a single use. Diaphragm  A diaphragm is a soft, dome-shaped barrier. It is inserted into the vagina before sex, along with a spermicide. The diaphragm blocks sperm from entering the uterus, and the spermicide kills sperm. A diaphragm should be left in the vagina for 6-8 hours after sex and removed within 24 hours. A diaphragm is prescribed and fitted by a health care provider. A diaphragm should be replaced every 1-2 years, after giving birth, after gaining more than 15 lb (6.8 kg), and after pelvic surgery. Cervical cap  A cervical cap is a round, soft latex or plastic cup that fits over the cervix. It is inserted into the vagina before sex, along with spermicide. It blocks sperm from entering the uterus. The cap should be left in place for 6-8 hours after sex and removed within 48 hours. A cervical cap must be prescribed and fitted by a health care provider. It should be replaced every 2 years. Sponge  A sponge is a soft, circular piece of polyurethane foam with spermicide on it. The sponge helps block sperm from entering the uterus, and the spermicide kills sperm. To use it, you make it wet and then insert it into the vagina. It should be inserted before sex, left in for at least 6 hours after sex, and removed and thrown away within 30 hours. Spermicides Spermicides are chemicals that kill or block sperm from entering the cervix and uterus. They can come as a cream, jelly, suppository, foam, or tablet. A spermicide should be inserted into the vagina with an applicator at least 10-15 minutes before sex to allow time for it to work. The process must be repeated every time you have  sex. Spermicides do not require a prescription. Intrauterine contraception Intrauterine device (IUD) An IUD is a T-shaped device that is put in a woman's uterus. There are two types:  Hormone IUD.This type contains progestin, a synthetic form of the hormone progesterone. This type can stay in place for 3-5 years.  Copper IUD.This type is wrapped in copper wire. It can stay in place for 10 years.  Permanent methods of contraception Female tubal ligation In this method, a woman's fallopian tubes are sealed, tied, or blocked during surgery to prevent eggs from traveling to the uterus. Hysteroscopic sterilization In this method, a small, flexible insert is placed into each fallopian tube. The inserts cause scar tissue to form in the fallopian tubes and block them, so sperm cannot reach an egg. The procedure takes about 3 months to be effective. Another form of birth control must be used during those 3 months. Female sterilization This is a procedure to tie off the tubes that carry sperm (vasectomy). After the procedure, the man can still ejaculate fluid (semen). Natural planning methods Natural family planning In this method, a couple does not have sex on days when the woman could become pregnant. Calendar method This means keeping track of the length of each menstrual cycle, identifying the days when pregnancy can happen, and not having sex on those days. Ovulation method In this method, a couple avoids sex during ovulation. Symptothermal method This method involves not having sex during ovulation. The woman typically checks for ovulation by watching changes in her temperature and in the consistency of cervical mucus. Post-ovulation method In this method, a couple waits to have sex until after  ovulation. Summary  Contraception, also called birth control, means methods or devices that prevent pregnancy.  Hormonal methods of contraception include implants, injections, pills, patches, vaginal  rings, and emergency contraceptives.  Barrier methods of contraception can include female condoms, female condoms, diaphragms, cervical caps, sponges, and spermicides.  There are two types of IUDs (intrauterine devices). An IUD can be put in a woman's uterus to prevent pregnancy for 3-5 years.  Permanent sterilization can be done through a procedure for males, females, or both.  Natural family planning methods involve not having sex on days when the woman could become pregnant. This information is not intended to replace advice given to you by your health care provider. Make sure you discuss any questions you have with your health care provider. Document Released: 04/08/2005 Document Revised: 04/10/2017 Document Reviewed: 05/11/2016 Elsevier Patient Education  2020 [employer].   Breastfeeding  Choosing to breastfeed is one of the best decisions you can make for yourself and your baby. A change in hormones during pregnancy causes your breasts to make breast milk in your milk-producing glands. Hormones prevent breast milk from being released before your baby is born. They also prompt milk flow after birth. Once breastfeeding has begun, thoughts of your baby, as well as his or her sucking or crying, can stimulate the release of milk from your milk-producing glands. Benefits of breastfeeding Research shows that breastfeeding offers many health benefits for infants and mothers. It also offers a cost-free and convenient way to feed your baby. For your baby  Your first milk (colostrum) helps your baby's digestive system to function better.  Special cells in your milk (antibodies) help your baby to fight off infections.  Breastfed babies are less likely to develop asthma, allergies, obesity, or type 2 diabetes. They are also at lower risk for sudden infant death syndrome (SIDS).  Nutrients in breast milk are better able to meet your babys needs compared to infant formula.  Breast milk improves  your baby's brain development. For you  Breastfeeding helps to create a very special bond between you and your baby.  Breastfeeding is convenient. Breast milk costs nothing and is always available at the correct temperature.  Breastfeeding helps to burn calories. It helps you to lose the weight that you gained during pregnancy.  Breastfeeding makes your uterus return faster to its size before pregnancy. It also slows bleeding (lochia) after you give birth.  Breastfeeding helps to lower your risk of developing type 2 diabetes, osteoporosis, rheumatoid arthritis, cardiovascular disease, and breast, ovarian, uterine, and endometrial cancer later in life. Breastfeeding basics Starting breastfeeding  Find a comfortable place to sit or lie down, with your neck and back well-supported.  Place a pillow or a rolled-up blanket under your baby to bring him or her to the level of your breast (if you are seated). Nursing pillows are specially designed to help support your arms and your baby while you breastfeed.  Make sure that your baby's tummy (abdomen) is facing your abdomen.  Gently massage your breast. With your fingertips, massage from the outer edges of your breast inward toward the nipple. This encourages milk flow. If your milk flows slowly, you may need to continue this action during the feeding.  Support your breast with 4 fingers underneath and your thumb above your nipple (make the letter "C" with your hand). Make sure your fingers are well away from your nipple and your babys mouth.  Stroke your baby's lips gently with your finger or nipple.  When your baby's mouth is open wide enough, quickly bring your baby to your breast, placing your entire nipple and as much of the areola as possible into your baby's mouth. The areola is the colored area around your nipple. ? More areola should be visible above your baby's upper lip than below the lower lip. ? Your baby's lips should be opened and  extended outward (flanged) to ensure an adequate, comfortable latch. ? Your baby's tongue should be between his or her lower gum and your breast.  Make sure that your baby's mouth is correctly positioned around your nipple (latched). Your baby's lips should create a seal on your breast and be turned out (everted).  It is common for your baby to suck about 2-3 minutes in order to start the flow of breast milk. Latching Teaching your baby how to latch onto your breast properly is very important. An improper latch can cause nipple pain, decreased milk supply, and poor weight gain in your baby. Also, if your baby is not latched onto your nipple properly, he or she may swallow some air during feeding. This can make your baby fussy. Burping your baby when you switch breasts during the feeding can help to get rid of the air. However, teaching your baby to latch on properly is still the best way to prevent fussiness from swallowing air while breastfeeding. Signs that your baby has successfully latched onto your nipple  Silent tugging or silent sucking, without causing you pain. Infant's lips should be extended outward (flanged).  Swallowing heard between every 3-4 sucks once your milk has started to flow (after your let-down milk reflex occurs).  Muscle movement above and in front of his or her ears while sucking. Signs that your baby has not successfully latched onto your nipple  Sucking sounds or smacking sounds from your baby while breastfeeding.  Nipple pain. If you think your baby has not latched on correctly, slip your finger into the corner of your babys mouth to break the suction and place it between your baby's gums. Attempt to start breastfeeding again. Signs of successful breastfeeding Signs from your baby  Your baby will gradually decrease the number of sucks or will completely stop sucking.  Your baby will fall asleep.  Your baby's body will relax.  Your baby will retain a small  amount of milk in his or her mouth.  Your baby will let go of your breast by himself or herself. Signs from you  Breasts that have increased in firmness, weight, and size 1-3 hours after feeding.  Breasts that are softer immediately after breastfeeding.  Increased milk volume, as well as a change in milk consistency and color by the fifth day of breastfeeding.  Nipples that are not sore, cracked, or bleeding. Signs that your baby is getting enough milk  Wetting at least 1-2 diapers during the first 24 hours after birth.  Wetting at least 5-6 diapers every 24 hours for the first week after birth. The urine should be clear or pale yellow by the age of 5 days.  Wetting 6-8 diapers every 24 hours as your baby continues to grow and develop.  At least 3 stools in a 24-hour period by the age of 5 days. The stool should be soft and yellow.  At least 3 stools in a 24-hour period by the age of 7 days. The stool should be seedy and yellow.  No loss of weight greater than 10% of birth weight during the first 3 days  of life.  Average weight gain of 4-7 oz (113-198 g) per week after the age of 4 days.  Consistent daily weight gain by the age of 5 days, without weight loss after the age of 2 weeks. After a feeding, your baby may spit up a small amount of milk. This is normal. Breastfeeding frequency and duration Frequent feeding will help you make more milk and can prevent sore nipples and extremely full breasts (breast engorgement). Breastfeed when you feel the need to reduce the fullness of your breasts or when your baby shows signs of hunger. This is called "breastfeeding on demand." Signs that your baby is hungry include:  Increased alertness, activity, or restlessness.  Movement of the head from side to side.  Opening of the mouth when the corner of the mouth or cheek is stroked (rooting).  Increased sucking sounds, smacking lips, cooing, sighing, or squeaking.  Hand-to-mouth movements  and sucking on fingers or hands.  Fussing or crying. Avoid introducing a pacifier to your baby in the first 4-6 weeks after your baby is born. After this time, you may choose to use a pacifier. Research has shown that pacifier use during the first year of a baby's life decreases the risk of sudden infant death syndrome (SIDS). Allow your baby to feed on each breast as long as he or she wants. When your baby unlatches or falls asleep while feeding from the first breast, offer the second breast. Because newborns are often sleepy in the first few weeks of life, you may need to awaken your baby to get him or her to feed. Breastfeeding times will vary from baby to baby. However, the following rules can serve as a guide to help you make sure that your baby is properly fed:  Newborns (babies 284 weeks of age or younger) may breastfeed every 1-3 hours.  Newborns should not go without breastfeeding for longer than 3 hours during the day or 5 hours during the night.  You should breastfeed your baby a minimum of 8 times in a 24-hour period. Breast milk pumping     Pumping and storing breast milk allows you to make sure that your baby is exclusively fed your breast milk, even at times when you are unable to breastfeed. This is especially important if you go back to work while you are still breastfeeding, or if you are not able to be present during feedings. Your lactation consultant can help you find a method of pumping that works best for you and give you guidelines about how long it is safe to store breast milk. Caring for your breasts while you breastfeed Nipples can become dry, cracked, and sore while breastfeeding. The following recommendations can help keep your breasts moisturized and healthy:  Avoid using soap on your nipples.  Wear a supportive bra designed especially for nursing. Avoid wearing underwire-style bras or extremely tight bras (sports bras).  Air-dry your nipples for 3-4 minutes after  each feeding.  Use only cotton bra pads to absorb leaked breast milk. Leaking of breast milk between feedings is normal.  Use lanolin on your nipples after breastfeeding. Lanolin helps to maintain your skin's normal moisture barrier. Pure lanolin is not harmful (not toxic) to your baby. You may also hand express a few drops of breast milk and gently massage that milk into your nipples and allow the milk to air-dry. In the first few weeks after giving birth, some women experience breast engorgement. Engorgement can make your breasts feel heavy, warm,  and tender to the touch. Engorgement peaks within 3-5 days after you give birth. The following recommendations can help to ease engorgement:  Completely empty your breasts while breastfeeding or pumping. You may want to start by applying warm, moist heat (in the shower or with warm, water-soaked hand towels) just before feeding or pumping. This increases circulation and helps the milk flow. If your baby does not completely empty your breasts while breastfeeding, pump any extra milk after he or she is finished.  Apply ice packs to your breasts immediately after breastfeeding or pumping, unless this is too uncomfortable for you. To do this: ? Put ice in a plastic bag. ? Place a towel between your skin and the bag. ? Leave the ice on for 20 minutes, 2-3 times a day.  Make sure that your baby is latched on and positioned properly while breastfeeding. If engorgement persists after 48 hours of following these recommendations, contact your health care provider or a Advertising copywriter. Overall health care recommendations while breastfeeding  Eat 3 healthy meals and 3 snacks every day. Well-nourished mothers who are breastfeeding need an additional 450-500 calories a day. You can meet this requirement by increasing the amount of a balanced diet that you eat.  Drink enough water to keep your urine pale yellow or clear.  Rest often, relax, and continue to  take your prenatal vitamins to prevent fatigue, stress, and low vitamin and mineral levels in your body (nutrient deficiencies).  Do not use any products that contain nicotine or tobacco, such as cigarettes and e-cigarettes. Your baby may be harmed by chemicals from cigarettes that pass into breast milk and exposure to secondhand smoke. If you need help quitting, ask your health care provider.  Avoid alcohol.  Do not use illegal drugs or marijuana.  Talk with your health care provider before taking any medicines. These include over-the-counter and prescription medicines as well as vitamins and herbal supplements. Some medicines that may be harmful to your baby can pass through breast milk.  It is possible to become pregnant while breastfeeding. If birth control is desired, ask your health care provider about options that will be safe while breastfeeding your baby. Where to find more information: [employer]: www.llli.org Contact a health care provider if:  You feel like you want to stop breastfeeding or have become frustrated with breastfeeding.  Your nipples are cracked or bleeding.  Your breasts are red, tender, or warm.  You have: ? Painful breasts or nipples. ? A swollen area on either breast. ? A fever or chills. ? Nausea or vomiting. ? Drainage other than breast milk from your nipples.  Your breasts do not become full before feedings by the fifth day after you give birth.  You feel sad and depressed.  Your baby is: ? Too sleepy to eat well. ? Having trouble sleeping. ? More than 56 week old and wetting fewer than 6 diapers in a 24-hour period. ? Not gaining weight by 42 days of age.  Your baby has fewer than 3 stools in a 24-hour period.  Your baby's skin or the white parts of his or her eyes become yellow. Get help right away if:  Your baby is overly tired (lethargic) and does not want to wake up and feed.  Your baby develops an unexplained  fever. Summary  Breastfeeding offers many health benefits for infant and mothers.  Try to breastfeed your infant when he or she shows early signs of hunger.  Gently tickle  or stroke your baby's lips with your finger or nipple to allow the baby to open his or her mouth. Bring the baby to your breast. Make sure that much of the areola is in your baby's mouth. Offer one side and burp the baby before you offer the other side.  Talk with your health care provider or lactation consultant if you have questions or you face problems as you breastfeed. This information is not intended to replace advice given to you by your health care provider. Make sure you discuss any questions you have with your health care provider. Document Released: 04/08/2005 Document Revised: 07/03/2017 Document Reviewed: 05/10/2016 Elsevier Patient Education  2020 [employer].
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
## 2019-03-09 ENCOUNTER — Encounter: Payer: Self-pay | Admitting: Advanced Practice Midwife

## 2019-03-09 LAB — OBSTETRIC PANEL, INCLUDING HIV
Antibody Screen: NEGATIVE
Basophils Absolute: 0 10*3/uL (ref 0.0–0.2)
Basos: 0 %
EOS (ABSOLUTE): 0.1 10*3/uL (ref 0.0–0.4)
Eos: 1 %
HIV Screen 4th Generation wRfx: NONREACTIVE
Hematocrit: 31.2 % — ABNORMAL LOW (ref 34.0–46.6)
Hemoglobin: 10.6 g/dL — ABNORMAL LOW (ref 11.1–15.9)
Hepatitis B Surface Ag: NEGATIVE
Immature Grans (Abs): 0.1 10*3/uL (ref 0.0–0.1)
Immature Granulocytes: 1 %
Lymphocytes Absolute: 1.5 10*3/uL (ref 0.7–3.1)
Lymphs: 19 %
MCH: 29.4 pg (ref 26.6–33.0)
MCHC: 34 g/dL (ref 31.5–35.7)
MCV: 86 fL (ref 79–97)
Monocytes Absolute: 0.4 10*3/uL (ref 0.1–0.9)
Monocytes: 5 %
Neutrophils Absolute: 5.8 10*3/uL (ref 1.4–7.0)
Neutrophils: 74 %
Platelets: 298 10*3/uL (ref 150–450)
RBC: 3.61 x10E6/uL — ABNORMAL LOW (ref 3.77–5.28)
RDW: 13.4 % (ref 11.7–15.4)
RPR Ser Ql: NONREACTIVE
Rh Factor: POSITIVE
Rubella Antibodies, IGG: 4.83 index (ref >0.99)
WBC: 7.8 10*3/uL (ref 3.4–10.8)

## 2019-03-09 LAB — CERVICOVAGINAL ANCILLARY ONLY
Chlamydia: NEGATIVE
Comment: NEGATIVE
Comment: NORMAL
Neisseria Gonorrhea: NEGATIVE

## 2019-03-10 ENCOUNTER — Encounter: Payer: Self-pay | Admitting: Medical

## 2019-03-10 ENCOUNTER — Encounter: Payer: Self-pay | Admitting: *Deleted

## 2019-03-10 DIAGNOSIS — Z3480 Encounter for supervision of other normal pregnancy, unspecified trimester: Secondary | ICD-10-CM

## 2019-03-10 DIAGNOSIS — J45909 Unspecified asthma, uncomplicated: Secondary | ICD-10-CM

## 2019-03-10 DIAGNOSIS — O09529 Supervision of elderly multigravida, unspecified trimester: Secondary | ICD-10-CM

## 2019-03-10 DIAGNOSIS — O0930 Supervision of pregnancy with insufficient antenatal care, unspecified trimester: Secondary | ICD-10-CM

## 2019-03-10 LAB — URINE CULTURE, OB REFLEX: Organism ID, Bacteria: NO GROWTH

## 2019-03-10 LAB — CULTURE, OB URINE

## 2019-03-11 LAB — CYTOLOGY - PAP
Comment: NEGATIVE
Diagnosis: NEGATIVE
High risk HPV: NEGATIVE

## 2019-03-15 ENCOUNTER — Encounter: Payer: Self-pay | Admitting: Obstetrics and Gynecology

## 2019-03-22 ENCOUNTER — Encounter: Payer: Self-pay | Admitting: *Deleted

## 2019-03-22 ENCOUNTER — Encounter: Payer: Self-pay | Admitting: Obstetrics and Gynecology

## 2019-03-23 ENCOUNTER — Other Ambulatory Visit: Payer: Medicaid Other

## 2019-03-23 ENCOUNTER — Encounter (HOSPITAL_COMMUNITY): Payer: Self-pay

## 2019-03-23 ENCOUNTER — Ambulatory Visit (INDEPENDENT_AMBULATORY_CARE_PROVIDER_SITE_OTHER): Payer: Medicaid Other | Admitting: Obstetrics & Gynecology

## 2019-03-23 ENCOUNTER — Ambulatory Visit (HOSPITAL_COMMUNITY): Payer: Medicaid Other | Admitting: *Deleted

## 2019-03-23 ENCOUNTER — Ambulatory Visit (HOSPITAL_COMMUNITY): Payer: Medicaid Other

## 2019-03-23 ENCOUNTER — Ambulatory Visit (HOSPITAL_COMMUNITY)
Admission: RE | Admit: 2019-03-23 | Discharge: 2019-03-23 | Disposition: A | Discharge status: Home / Self Care | Payer: Medicaid Other | Source: Ambulatory Visit | Attending: Obstetrics & Gynecology | Admitting: Obstetrics & Gynecology

## 2019-03-23 ENCOUNTER — Other Ambulatory Visit: Payer: Self-pay

## 2019-03-23 VITALS — BP 118/71 | HR 86 | Wt 174.0 lb

## 2019-03-23 DIAGNOSIS — Z3480 Encounter for supervision of other normal pregnancy, unspecified trimester: Secondary | ICD-10-CM | POA: Insufficient documentation

## 2019-03-23 DIAGNOSIS — O0930 Supervision of pregnancy with insufficient antenatal care, unspecified trimester: Secondary | ICD-10-CM

## 2019-03-23 DIAGNOSIS — Z3A29 29 weeks gestation of pregnancy: Secondary | ICD-10-CM

## 2019-03-23 DIAGNOSIS — O0990 Supervision of high risk pregnancy, unspecified, unspecified trimester: Secondary | ICD-10-CM | POA: Insufficient documentation

## 2019-03-23 DIAGNOSIS — O09529 Supervision of elderly multigravida, unspecified trimester: Secondary | ICD-10-CM | POA: Diagnosis present

## 2019-03-23 DIAGNOSIS — O0933 Supervision of pregnancy with insufficient antenatal care, third trimester: Secondary | ICD-10-CM | POA: Diagnosis not present

## 2019-03-23 DIAGNOSIS — J45909 Unspecified asthma, uncomplicated: Secondary | ICD-10-CM | POA: Diagnosis present

## 2019-03-23 DIAGNOSIS — O09523 Supervision of elderly multigravida, third trimester: Secondary | ICD-10-CM | POA: Diagnosis not present

## 2019-03-23 DIAGNOSIS — O99891 Other specified diseases and conditions complicating pregnancy: Secondary | ICD-10-CM | POA: Diagnosis not present

## 2019-03-23 MED ORDER — DIPHENHYDRAMINE HCL 25 MG PO TABS: 25.0000 mg | ORAL_TABLET | Freq: Four times a day (QID) | ORAL | 0 refills | Status: DC | PRN

## 2019-03-23 NOTE — Patient Instructions (Signed)

## 2019-03-23 NOTE — Progress Notes (Signed)
   PRENATAL VISIT NOTE  Subjective:  Sheila [name] is a 33 y.o. G3P2002 at [redacted]w[redacted]d being seen today for ongoing prenatal care.  She is currently monitored for the following issues for this high-risk pregnancy and has Atypical squamous cell changes of undetermined significance (ASCUS) on cervical cytology with negative high risk human papilloma virus (HPV) test result; AMA (advanced maternal age) multigravida 5+; Asthma; Late prenatal care; and Supervision of other normal pregnancy, antepartum on their problem list.  Patient reports right index finger itches for 2 days.  Contractions: Not present. Vag. Bleeding: None.  Movement: Present. Denies leaking of fluid.   The following portions of the patient's history were reviewed and updated as appropriate: allergies, current medications, past family history, past medical history, past social history, past surgical history and problem list.   Objective:   Vitals:   03/23/19 0955  BP: 118/71  Pulse: 86  Weight: 174 lb (78.9 kg)    Fetal Status: Fetal Heart Rate (bpm): 135 Fundal Height: 29 cm Movement: Present     General:  Alert, oriented and cooperative. Patient is in no acute distress.  Skin: Skin is warm and dry. No rash noted.   Cardiovascular: Normal heart rate noted  Respiratory: Normal respiratory effort, no problems with respiration noted  Abdomen: Soft, gravid, appropriate for gestational age.  Pain/Pressure: Absent     Pelvic: Cervical exam deferred        Extremities: Normal range of motion.     Mental Status: Normal mood and affect. Normal behavior. Normal judgment and thought content.  Rash right finger c/w hypersensitivity unsure source Assessment and Plan:  Pregnancy: G3P2002 at [redacted]w[redacted]d 1. Supervision of other normal pregnancy, antepartum Routine labs [redacted]w[redacted]d Rash  - Glucose Tolerance, 2 Hours w/1 Hour - CBC - HIV antibody - RPR - diphenhydrAMINE (BENADRYL) 25 MG tablet; Take 1 tablet (25 mg total) by mouth every 6  (six) hours as needed.  Dispense: 30 tablet; Refill: 0 - Bile acids  2. Late prenatal care   3. Antepartum multigravida of advanced maternal age [place] today  Preterm labor symptoms and general obstetric precautions including but not limited to vaginal bleeding, contractions, leaking of fluid and fetal movement were reviewed in detail with the patient. Please refer to After Visit Summary for other counseling recommendations.   Return in about 4 weeks (around 04/20/2019).  Future Appointments  Date Time Provider [place]  03/23/2019  1:30 PM [place] [facility]  03/23/2019  1:30 PM [place] 1 WH-MFCUS [facility]-US  03/23/2019  2:30 PM [place] [facility] RM WH-MFC [facility]-US    Emeterio Reeve, MD

## 2019-03-25 ENCOUNTER — Encounter: Payer: Self-pay | Admitting: Obstetrics and Gynecology

## 2019-03-25 DIAGNOSIS — Z141 Cystic fibrosis carrier: Secondary | ICD-10-CM | POA: Insufficient documentation

## 2019-03-25 LAB — RPR: RPR Ser Ql: NONREACTIVE

## 2019-03-25 LAB — HIV ANTIBODY (ROUTINE TESTING W REFLEX): HIV Screen 4th Generation wRfx: NONREACTIVE

## 2019-03-25 LAB — GLUCOSE TOLERANCE, 2 HOURS W/ 1HR
Glucose, 1 hour: 177 mg/dL (ref 65–179)
Glucose, 2 hour: 144 mg/dL (ref 65–152)
Glucose, Fasting: 73 mg/dL (ref 65–91)

## 2019-03-25 LAB — BILE ACIDS, TOTAL: Bile Acids Total: 7.5 umol/L (ref 0.0–10.0)

## 2019-03-25 LAB — CBC
Hematocrit: 32.8 % — ABNORMAL LOW (ref 34.0–46.6)
Hemoglobin: 11.1 g/dL (ref 11.1–15.9)
MCH: 29.8 pg (ref 26.6–33.0)
MCHC: 33.8 g/dL (ref 31.5–35.7)
MCV: 88 fL (ref 79–97)
Platelets: 303 10*3/uL (ref 150–450)
RBC: 3.73 x10E6/uL — ABNORMAL LOW (ref 3.77–5.28)
RDW: 13.6 % (ref 11.7–15.4)
WBC: 7 10*3/uL (ref 3.4–10.8)

## 2019-03-30 ENCOUNTER — Other Ambulatory Visit: Payer: Self-pay

## 2019-03-30 ENCOUNTER — Telehealth (INDEPENDENT_AMBULATORY_CARE_PROVIDER_SITE_OTHER): Payer: Medicaid Other | Admitting: Family Medicine

## 2019-03-30 DIAGNOSIS — Z141 Cystic fibrosis carrier: Secondary | ICD-10-CM

## 2019-03-30 DIAGNOSIS — O0930 Supervision of pregnancy with insufficient antenatal care, unspecified trimester: Secondary | ICD-10-CM

## 2019-03-30 DIAGNOSIS — O09523 Supervision of elderly multigravida, third trimester: Secondary | ICD-10-CM

## 2019-03-30 DIAGNOSIS — O0933 Supervision of pregnancy with insufficient antenatal care, third trimester: Secondary | ICD-10-CM

## 2019-03-30 DIAGNOSIS — Z3480 Encounter for supervision of other normal pregnancy, unspecified trimester: Secondary | ICD-10-CM

## 2019-03-30 DIAGNOSIS — Z3A30 30 weeks gestation of pregnancy: Secondary | ICD-10-CM

## 2019-03-30 DIAGNOSIS — O09529 Supervision of elderly multigravida, unspecified trimester: Secondary | ICD-10-CM

## 2019-03-30 DIAGNOSIS — J45909 Unspecified asthma, uncomplicated: Secondary | ICD-10-CM

## 2019-03-30 MED ORDER — ALBUTEROL SULFATE HFA 108 (90 BASE) MCG/ACT IN AERS: 2.0000 | INHALATION_SPRAY | Freq: Four times a day (QID) | RESPIRATORY_TRACT | 3 refills | Status: AC | PRN

## 2019-03-30 NOTE — Progress Notes (Signed)
TELEHEALTH OBSTETRICS PRENATAL VIRTUAL VIDEO VISIT ENCOUNTER NOTE  Provider location: [facility] [place]   I connected with Sheila [name] on 03/30/19 at  2:00 PM EST by phone at home and verified that I am speaking with the correct person using two identifiers.   I discussed the limitations, risks, security and privacy concerns of performing an evaluation and management service virtually and the availability of in person appointments. I also discussed with the patient that there may be a patient responsible charge related to this service. The patient expressed understanding and agreed to proceed. Subjective:  Sheila [name] is a 33 y.o. G3P2002 at [redacted]w[redacted]d being seen today for ongoing prenatal care.  She is currently monitored for the following issues for this high-risk pregnancy and has Atypical squamous cell changes of undetermined significance (ASCUS) on cervical cytology with negative high risk human papilloma virus (HPV) test result; AMA (advanced maternal age) multigravida 52+; Asthma; Late prenatal care; Supervision of other normal pregnancy, antepartum; and Cystic fibrosis carrier on their problem list.  Patient reports SOB occasionally at night when she lays down, has h/o asthma..  Contractions: Not present. Vag. Bleeding: None.  Movement: Present. Denies any leaking of fluid.   The following portions of the patient's history were reviewed and updated as appropriate: allergies, current medications, past family history, past medical history, past social history, past surgical history and problem list.   Objective:   Vitals:   03/30/19 1357  BP: 112/77    Fetal Status:     Movement: Present     General:  Alert, oriented and cooperative. Patient is in no acute distress.  Respiratory: Normal respiratory effort, no problems with respiration noted  Mental Status: Normal mood and affect. Normal behavior. Normal judgment and thought content.  Rest of physical  exam deferred due to type of encounter  Imaging: [place] Mfm Ob Detail +14 Wk  Result Date: 03/23/2019 ----------------------------------------------------------------------  OBSTETRICS REPORT                       (Signed Final 03/23/2019 03:12 pm) ---------------------------------------------------------------------- Patient Info  ID #:       [id number]                          D.O.B.:  08-Dec-1985 (33 yrs)  Name:       Sheila [name]               Visit Date: 03/23/2019 01:39 pm ---------------------------------------------------------------------- Performed By  Performed By:     Corky Crafts             Ref. Address:     [address]  [place], [place]                                                             [id number]  Attending:        Ma RingsVictor Fang MD         [facility]  Referred By:      Tereso NewcomerUGONNA A                    ANYANWU MD ---------------------------------------------------------------------- Orders   #  Description                          Code         Ordered By   1  US MFM OB DETAIL +14 WK              [id number]     Jaynie CollinsUGONNA ANYANWU  ----------------------------------------------------------------------   #  Order #                    Accession #                 Episode #   1  [id number]                  [id number]                  [id number]  ---------------------------------------------------------------------- Indications   Advanced maternal age multigravida 2035+,        78O09.523   third trimester (LR NIPS)   Late to prenatal care, third trimester         O09.33   Encounter for antenatal screening for          Z36.3   malformations   Asthma                                         O99.89 j45.909   [redacted] weeks gestation of pregnancy                Z3A.29   ---------------------------------------------------------------------- Fetal Evaluation  Num Of Fetuses:         1  Fetal Heart Rate(bpm):  145  Cardiac Activity:       Observed  Presentation:           Breech  Placenta:               Posterior  P. Cord Insertion:      Visualized  Amniotic Fluid  AFI FV:      Within normal limits  AFI Sum(cm)     %Tile       Largest Pocket(cm)  11.55           25  4.26  RUQ(cm)       RLQ(cm)       LUQ(cm)        LLQ(cm)  0             4.19          3.1            4.26 ---------------------------------------------------------------------- Biometry  BPD:      70.3  mm     G. Age:  28w 2d         17  %    CI:        72.22   %    70 - 86                                                          FL/HC:      21.5   %    19.6 - 20.8  HC:      263.2  mm     G. Age:  28w 4d         11  %    HC/AC:      1.10        0.99 - 1.21  AC:      240.2  mm     G. Age:  28w 2d         23  %    FL/BPD:     80.5   %    71 - 87  FL:       56.6  mm     G. Age:  29w 5d         56  %    FL/AC:      23.6   %    20 - 24  HUM:      50.3  mm     G. Age:  29w 3d         53  %  CER:      33.1  mm     G. Age:  29w 0d         51  %  LV:          3  mm  CM:        5.3  mm  Est. FW:    1287  gm    2 lb 13 oz      30  % ---------------------------------------------------------------------- OB History  Gravidity:    3         Term:   2  Living:       2 ---------------------------------------------------------------------- Gestational Age  LMP:           29w 0d        Date:  09/01/18                 EDD:   06/08/19  U/S Today:     28w 5d                                        EDD:   06/10/19  Best:          29w 0d     Det. By:  LMP  (09/01/18)  EDD:   06/08/19 ---------------------------------------------------------------------- Anatomy  Cranium:               Appears normal         LVOT:                   Appears normal  Cavum:                 Appears normal         Aortic Arch:            Appears normal   Ventricles:            Appears normal         Ductal Arch:            Not well visualized  Choroid Plexus:        Appears normal         Diaphragm:              Appears normal  Cerebellum:            Appears normal         Stomach:                Appears normal, left                                                                        sided  Posterior Fossa:       Appears normal         Abdomen:                Appears normal  Nuchal Fold:           Not applicable (>20    Abdominal Wall:         Appears nml (cord                         wks GA)                                        insert, abd wall)  Face:                  Appears normal         Cord Vessels:           Appears normal (3                         (orbits and profile)                           vessel cord)  Lips:                  Appears normal         Kidneys:                Appear normal  Palate:                Previously seen        Bladder:  Appears normal  Thoracic:              Appears normal         Spine:                  Ltd views no                                                                        intracranial signs of                                                                        NTD  Heart:                 Appears normal         Upper Extremities:      Appears normal                         (4CH, axis, and                         situs)  RVOT:                  Appears normal         Lower Extremities:      Appears normal  Other:  Heels and 5th digit nwv.  Fetus appears to be female. Nasal bone          visualized. Technically difficult due to advanced GA and fetal position. ---------------------------------------------------------------------- Cervix Uterus Adnexa  Cervix  Length:           4.68  cm.  Normal appearance by transabdominal scan.  Uterus  No abnormality visualized.  Left Ovary  Size(cm)       2.8  x   1.9    x  1.7       Vol(ml): 4.74  Within normal limits.  Right Ovary  Size(cm)       2.9  x   1.3     x  1.6       Vol(ml): 3.16  Within normal limits. ---------------------------------------------------------------------- Comments  This patient was seen for a detailed fetal anatomy scan due  to advanced maternal age.  The patient presented late for  prenatal care in her current pregnancy. She denies any  significant past medical history and denies any problems in  her current pregnancy.  She had a cell free DNA test earlier in her pregnancy which  indicated a low risk for trisomy 12, 67, and 13. A female fetus  is predicted.  She was informed that the fetal growth and amniotic fluid  level were appropriate for her gestational age.  There were no obvious fetal anomalies noted on today's  ultrasound exam.  However, today's exam was limited due to  her advanced gestational age.  The patient was informed that anomalies may be missed due  to technical limitations. If  the fetus is in a suboptimal position  or maternal habitus is increased, visualization of the fetus in  the maternal uterus may be impaired.  The increased risk of fetal aneuploidy due to advanced  maternal age was discussed. Due to advanced maternal age,  the patient was offered and declined an amniocentesis today  for definitive diagnosis of fetal aneuploidy.  Follow up as indicated. ----------------------------------------------------------------------                   Ma Rings, MD Electronically Signed Final Report   03/23/2019 03:12 pm ----------------------------------------------------------------------   Assessment and Plan:  Pregnancy: [id number] at [redacted]w[redacted]d 1. Cystic fibrosis carrier Will arrange genetic counseling and testing for FOB  2. Supervision of other normal pregnancy, antepartum Good fetal movement  3. Antepartum multigravida of advanced maternal age   35. Uncomplicated asthma, unspecified asthma severity, unspecified whether persistent Albuterol prescribed. Patient out of work due to asthma and COVID risk.  5. Late  prenatal care   Preterm labor symptoms and general obstetric precautions including but not limited to vaginal bleeding, contractions, leaking of fluid and fetal movement were reviewed in detail with the patient. I discussed the assessment and treatment plan with the patient. The patient was provided an opportunity to ask questions and all were answered. The patient agreed with the plan and demonstrated an understanding of the instructions. The patient was advised to call back or seek an in-person office evaluation/go to [facility] at [facility] for any urgent or concerning symptoms. Please refer to After Visit Summary for other counseling recommendations.   I provided 17 minutes of face-to-face time during this encounter.  No follow-ups on file.  Future Appointments  Date Time Provider Department Center  04/20/2019 11:00 AM Adam Phenix, MD [facility] None    Levie Heritage, DO [facility] [employer], [facility]

## 2019-04-01 ENCOUNTER — Ambulatory Visit (HOSPITAL_COMMUNITY): Payer: Medicaid Other | Attending: Obstetrics and Gynecology | Admitting: Genetic Counselor

## 2019-04-01 ENCOUNTER — Other Ambulatory Visit: Payer: Self-pay

## 2019-04-01 DIAGNOSIS — Z315 Encounter for genetic counseling: Secondary | ICD-10-CM | POA: Diagnosis not present

## 2019-04-01 DIAGNOSIS — Z141 Cystic fibrosis carrier: Secondary | ICD-10-CM | POA: Diagnosis not present

## 2019-04-01 DIAGNOSIS — O09893 Supervision of other high risk pregnancies, third trimester: Secondary | ICD-10-CM

## 2019-04-01 DIAGNOSIS — Z3A30 30 weeks gestation of pregnancy: Secondary | ICD-10-CM

## 2019-04-01 NOTE — Progress Notes (Signed)
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 [name] came to her appointment alone due to COVID-19 visitor restrictions. This session was facilitated by [employer], ID# [phone].   Indication for genetic counseling - Carrier for cystic fibrosis  Prenatal history  [name] is a [id number], 33 y.o. female. Her current pregnancy has completed [redacted]w[redacted]d(Estimated Date of Delivery: 06/08/19).  Ms. MNicolasa Duckingdenied exposure to environmental toxins or chemical agents. She denied the use of alcohol, tobacco or street drugs. She reported taking prenatal vitamins. She denied significant viral illnesses, fevers, and bleeding during the course of her pregnancy. Her medical and surgical histories were noncontributory.  Family History  A three generation pedigree was drafted and reviewed. Both family histories were reviewed and found to be noncontributory for birth defects, intellectual disability, recurrent pregnancy loss, and known genetic conditions.    The patient's ethnicity is [place]ien. The father of the pregnancy's ethnicity is [place]ien. Ashkenazi Jewish ancestry and consanguinity were denied. Pedigree will be scanned under Media.  Discussion  Ms. Mounkailahad Horizon-14carrier screeningperformedthrough [employer]. The results of the screen identified her as a carrier for cystic fibrosis (CF). CF is a condition characterized by the buildup of thick, sticky mucus that can damage the body's organs. Mucus is a slippery substance that lubricates and protects the linings of the airways, digestive system, reproductive system, and other organs and tissues. Individuals with CF have abnormally sticky mucus that can clog the airways and digestive system, leading to progressive damage to the respiratory system  and chronic digestive system problems. The most common features of CF include respiratory difficulties, bacterial infections in the lungs, the formation of scar tissue (fibrosis) and cysts in the lungs, pancreatic insufficiency, CF-related diabetes mellitus, diarrhea, malnutrition, poor growth, and weight loss. Most men with CF have congenital bilateral absence of the vas deferens (CBAVD) which causes female infertility. CF is variably expressed, meaning features of the condition and their severity vary among affected individuals.   CF is caused by mutations in the CFTR gene. This gene provides instructions for a channel that transports chloride ions into and out of cells. The flow of chloride ions helps control the movement of water in the body's tissues, which is necessary for the production of thin, freely flowing mucus. Pathogenic variants in the CFTR gene disrupt the function of the chloride channels, preventing them from regulating the flow of chloride ions and water across cell membranes. This leads to symptoms associated CF is inherited in an autosomal recessive pattern, meaning the current fetus is only at risk forCFif Sheila [name]'s partner is also a carrierfor the condition. Based on the pan-ethnic carrier frequency for CF, Sheila [name]'s partner has a 1 in 447chance of being a carrier for CF. If Sheila [name]'s partner is also a carrier, the risk for CF in the pregnancy would be 1 in 4 (25%).   Ms. MSharilyn Sitescarrier screening was negative for the other 13 conditions screened. Thus, her risk to be a carrier for these additional conditions (listed separately in the laboratory report) has been reduced but not eliminated. This also significantly reduces her risk of having a child affected by one of these conditions. We discussed that carrier testing for cystic fibrosis is recommended for Sheila [name]'s partner. Ms. MNicolasa Duckingindicated that she is interested in pursuing partner carrier  screening.  We also  reviewed that Sheila [name] had Panorama NIPS through the laboratory [facility] that was low-risk for fetal aneuploidies. We reviewed that these results showed a less than 1 in 10,000 risk for trisomies 21, 18 and 13, and monosomy X (Turner syndrome).  In addition, the risk for triploidy and sex chromosome trisomies (47,XXX and 47,XXY) was also low. [name] elected to have cfDNA analysis for 22q11.2 deletion syndrome, which was also low risk (1 in 9000). We reviewed that while this testing identifies 94-99% of pregnancies with trisomy 81, trisomy 67, sex chromosome aneuploidies, and triploidy, it is NOT diagnostic. A positive test result requires confirmation by CVS or amniocentesis, and a negative test result does not rule out a fetal chromosome abnormality. She also understands that this testing does not identify all genetic conditions.  A complete ultrasound was performed on 04/01/19. The ultrasound report was sent under separate cover. There were no visualized fetal anomalies or markers suggestive of aneuploidy.  [name] was also counseled regarding diagnostic testing via amniocentesis. We discussed the technical aspects of the procedure and quoted up to a 1 in 500 (0.2%) risk for preterm labor or other adverse pregnancy outcomes as a result of amniocentesis. Cultured cells from an amniocentesis sample allow for the visualization of a fetal karyotype, which can detect >99% of chromosomal aberrations. Chromosomal microarray can also be performed to identify smaller deletions or duplications of fetal chromosomal material. Amniocentesis could also be performed to assess whether the baby is affected by cystic fibrosis. After careful consideration, Sheila [name] declined amniocentesis at this time. She understands that amniocentesis is available at any point after 16 weeks of pregnancy and that she may opt to undergo the procedure at a later date should she change her mind.  Ms.  Sheila [name] desired partner carrier screening for cystic fibrosis for her partner, Evlyn Kanner; however, he does not have health insurance. I coordinated partner carrier screening through the laboratory [facility] as Mr. Joseph Berkshire qualifies for free testing through their Patient Assistance Program. I provided Sheila [name] a copy of the Patient Assistance Program application for Mr. Seydouali to sign and requested that she email me a photo of the signed form and the couple's tax forms from the previous filing year so that I can facilitate his free testing. I also provided Sheila [name] with a saliva kit for sample collection. Once the laboratory receives Mr. Seydouali's sample, results will take 2-3 weeks to be returned. I will call Sheila [name] with results when they become available.  I counseled Sheila [name] regarding the above risks and available options. The approximate face-to-face time with the genetic counselor was 45 minutes.  In summary:  Discussed carrier screening results and options for follow-up testing  Carrier for cystic fibrosis  Desires partner carrier screening via saliva kit. Orders for partner carrier screening were placed today. She will send me copy of documents required for free testing. We will follow results  Reviewed low-risk NIPS result  Reduction in risk forDown syndrome,trisomy 18,trisomy 81, sex chromosome aneuploidies, and 22q11.2 deletion syndrome  Reviewed results of ultrasound  No fetal anomalies or markers seen  Reduction in risk for fetal aneuploidy  Offered additional testing and screening  Declined amniocentesis  Reviewed family history concerns   Buelah Manis, MS Genetic Counselor

## 2019-04-07 ENCOUNTER — Ambulatory Visit (HOSPITAL_COMMUNITY): Payer: Self-pay | Admitting: Genetic Counselor

## 2019-04-12 ENCOUNTER — Telehealth (HOSPITAL_COMMUNITY): Payer: Self-pay | Admitting: Genetic Counselor

## 2019-04-12 NOTE — Telephone Encounter (Signed)
I called [name] to discuss her partner, Sheila [name] negative carrier screening results for cystic fibrosis (CF). Mr. Sheila [name] was negative for pathogenic variants in the CFTR gene associated with CF. Based on this negative result and his ethnic background, he has a 1 in 4400-6000 residual risk of being a carrier for CF. This negative partner carrier screening result significantly decreases the chance of the couple's fetuses being affected by CF.   While I was on the phone with [name], I requested that she send me a photo of Mr. Sheila [name] signed Patient Assistance Program application so that I can ensure he does not receive a bill. She indicated that she will send me an email with a photo of the form after our phone call. [name] confirmed that she had no further questions at this time.   Sheila Manis, MS Genetic Counselor

## 2019-04-20 ENCOUNTER — Telehealth: Payer: Medicaid Other | Admitting: Obstetrics & Gynecology

## 2019-04-23 NOTE — L&D Delivery Note (Addendum)
OB/GYN Faculty Practice Delivery Note  Sheila [name] is a 34 y.o. [id number] s/p NSVD at [redacted]w[redacted]d. She was admitted for precipitous labor.   ROM: 0h 41m with clear fluid GBS Status: positive- no antibiotic prophylaxis Maximum Maternal Temperature: 98.1*F  Labor Progress: Patient arrived to MAU completely dilated in active labor. She delivered precipitously after AROM with clear fluid.  Delivery Date/Time: 06/11/2019, 0111 hours Delivery: Called to room and patient was complete and pushing. Head delivered OA. No nuchal cord present. Shoulder and body delivered in usual fashion. Infant with spontaneous cry, placed on mother's abdomen, dried and stimulated. Cord clamped x 2 after 1-minute delay, and cut by FOB under my direct supervision. Cord blood drawn. Placenta delivered spontaneously with gentle cord traction. Fundus firm with massage and Pitocin. Labia, perineum, vagina, and cervix were inspected, and a first degree laceration was noted- repaired with Vicryl Rapide in the usual fashion.   Placenta: 3 vessel cord, intact, to L&D Complications: None Lacerations: 1st degree- repaired with Vicryl Rapide in the usual fashion EBL: 400 mL Analgesia: 1% lidocaine for perineal repair  Postpartum Planning [x]  message to sent to schedule follow-up  [x]  vaccines UTD  Infant: girl  APGARs 9, 9  3480 g  , DO OB/GYN Fellow, Faculty Practice   OB Attending , MD

## 2019-04-26 ENCOUNTER — Ambulatory Visit (HOSPITAL_COMMUNITY): Payer: Self-pay | Admitting: Obstetrics

## 2019-04-30 ENCOUNTER — Telehealth (INDEPENDENT_AMBULATORY_CARE_PROVIDER_SITE_OTHER): Payer: Medicaid Other | Admitting: Women's Health

## 2019-04-30 ENCOUNTER — Encounter: Payer: Self-pay | Admitting: Women's Health

## 2019-04-30 VITALS — BP 122/72

## 2019-04-30 DIAGNOSIS — R87610 Atypical squamous cells of undetermined significance on cytologic smear of cervix (ASC-US): Secondary | ICD-10-CM

## 2019-04-30 DIAGNOSIS — J45909 Unspecified asthma, uncomplicated: Secondary | ICD-10-CM

## 2019-04-30 DIAGNOSIS — Z3A34 34 weeks gestation of pregnancy: Secondary | ICD-10-CM | POA: Diagnosis not present

## 2019-04-30 DIAGNOSIS — Z141 Cystic fibrosis carrier: Secondary | ICD-10-CM

## 2019-04-30 DIAGNOSIS — O0933 Supervision of pregnancy with insufficient antenatal care, third trimester: Secondary | ICD-10-CM

## 2019-04-30 DIAGNOSIS — O09523 Supervision of elderly multigravida, third trimester: Secondary | ICD-10-CM

## 2019-04-30 DIAGNOSIS — Z3480 Encounter for supervision of other normal pregnancy, unspecified trimester: Secondary | ICD-10-CM

## 2019-04-30 DIAGNOSIS — O0930 Supervision of pregnancy with insufficient antenatal care, unspecified trimester: Secondary | ICD-10-CM

## 2019-04-30 NOTE — Progress Notes (Signed)
I connected with Hurshel Party on 04/30/19 at  9:15 AM EST by: MyChart and verified that I am speaking with the correct person using two identifiers.  Patient is located at home and provider is located at [facility].     The purpose of this virtual visit is to provide medical care while limiting exposure to the novel coronavirus. I discussed the limitations, risks, security and privacy concerns of performing an evaluation and management service by MyChart and the availability of in person appointments. I also discussed with the patient that there may be a patient responsible charge related to this service. By engaging in this virtual visit, you consent to the provision of healthcare.  Additionally, you authorize for your insurance to be billed for the services provided during this visit.  The patient expressed understanding and agreed to proceed.  The following staff members participated in the virtual visit:  Donia Ast, NP    PRENATAL VISIT NOTE  Subjective:  Sheila [name] is a 34 y.o. G3P2002 at [redacted]w[redacted]d  for phone visit for ongoing prenatal care.  She is currently monitored for the following issues for this low-risk pregnancy and has Atypical squamous cell changes of undetermined significance (ASCUS) on cervical cytology with negative high risk human papilloma virus (HPV) test result; AMA (advanced maternal age) multigravida 35+; Asthma; Late prenatal care; Supervision of other normal pregnancy, antepartum; and Cystic fibrosis carrier on their problem list.  Patient reports no complaints.  Contractions: Not present. Vag. Bleeding: None.  Movement: Present. Denies leaking of fluid.   The following portions of the patient's history were reviewed and updated as appropriate: allergies, current medications, past family history, past medical history, past social history, past surgical history and problem list.   Objective:   Vitals:   04/30/19 0924  BP: 122/72   Self-Obtained  Fetal  Status:     Movement: Present     Assessment and Plan:  Pregnancy: G3P2002 at [redacted]w[redacted]d  1. Supervision of other normal pregnancy, antepartum -discussed contraception, pt undecided -support person is FOB -GBS/cultures next visit  2. Multigravida of advanced maternal age in third trimester  3. Atypical squamous cell changes of undetermined significance (ASCUS) on cervical cytology with negative high risk human papilloma virus (HPV) test result -needs repeat Pap ppartum or 07/2019 at latest  4. Uncomplicated asthma, unspecified asthma severity, unspecified whether persistent -pt reports has inhaler at home, no exacerbations, last used 3 days ago and worked  5. Cystic fibrosis carrier -genetic counseling 04/01/2019 -partner tested negative  Preterm labor symptoms and general obstetric precautions including but not limited to vaginal bleeding, contractions, leaking of fluid and fetal movement were reviewed in detail with the patient. I discussed the assessment and treatment plan with the patient. The patient was provided an opportunity to ask questions and all were answered. The patient agreed with the plan and demonstrated an understanding of the instructions. The patient was advised to call back or seek an in-person office evaluation/go to [facility] at [facility] for any urgent or concerning symptoms.  Return in about 2 weeks (around 05/14/2019) for in-person/GBS/culture.  No future appointments.  Time spent on virtual visit: 10 minutes  Marylen Ponto, NP

## 2019-04-30 NOTE — Patient Instructions (Addendum)
Contraception Choices Contraception, also called birth control, refers to methods or devices that prevent pregnancy. Hormonal methods Contraceptive implant  A contraceptive implant is a thin, plastic tube that contains a hormone. It is inserted into the upper part of the arm. It can remain in place for up to 3 years. Progestin-only injections Progestin-only injections are injections of progestin, a synthetic form of the hormone progesterone. They are given every 3 months by a health care provider. Birth control pills  Birth control pills are pills that contain hormones that prevent pregnancy. They must be taken once a day, preferably at the same time each day. Birth control patch  The birth control patch contains hormones that prevent pregnancy. It is placed on the skin and must be changed once a week for three weeks and removed on the fourth week. A prescription is needed to use this method of contraception. Vaginal ring  A vaginal ring contains hormones that prevent pregnancy. It is placed in the vagina for three weeks and removed on the fourth week. After that, the process is repeated with a new ring. A prescription is needed to use this method of contraception. Emergency contraceptive Emergency contraceptives prevent pregnancy after unprotected sex. They come in pill form and can be taken up to 5 days after sex. They work best the sooner they are taken after having sex. Most emergency contraceptives are available without a prescription. This method should not be used as your only form of birth control. Barrier methods Female condom  A female condom is a thin sheath that is worn over the penis during sex. Condoms keep sperm from going inside a woman's body. They can be used with a spermicide to increase their effectiveness. They should be disposed after a single use. Female condom  A female condom is a soft, loose-fitting sheath that is put into the vagina before sex. The condom keeps sperm  from going inside a woman's body. They should be disposed after a single use. Diaphragm  A diaphragm is a soft, dome-shaped barrier. It is inserted into the vagina before sex, along with a spermicide. The diaphragm blocks sperm from entering the uterus, and the spermicide kills sperm. A diaphragm should be left in the vagina for 6-8 hours after sex and removed within 24 hours. A diaphragm is prescribed and fitted by a health care provider. A diaphragm should be replaced every 1-2 years, after giving birth, after gaining more than 15 lb (6.8 kg), and after pelvic surgery. Cervical cap  A cervical cap is a round, soft latex or plastic cup that fits over the cervix. It is inserted into the vagina before sex, along with spermicide. It blocks sperm from entering the uterus. The cap should be left in place for 6-8 hours after sex and removed within 48 hours. A cervical cap must be prescribed and fitted by a health care provider. It should be replaced every 2 years. Sponge  A sponge is a soft, circular piece of polyurethane foam with spermicide on it. The sponge helps block sperm from entering the uterus, and the spermicide kills sperm. To use it, you make it wet and then insert it into the vagina. It should be inserted before sex, left in for at least 6 hours after sex, and removed and thrown away within 30 hours. Spermicides Spermicides are chemicals that kill or block sperm from entering the cervix and uterus. They can come as a cream, jelly, suppository, foam, or tablet. A spermicide should be inserted into the   vagina with an applicator at least 10-15 minutes before sex to allow time for it to work. The process must be repeated every time you have sex. Spermicides do not require a prescription. Intrauterine contraception Intrauterine device (IUD) An IUD is a T-shaped device that is put in a woman's uterus. There are two types:  Hormone IUD.This type contains progestin, a synthetic form of the hormone  progesterone. This type can stay in place for 3-5 years.  Copper IUD.This type is wrapped in copper wire. It can stay in place for 10 years.  Permanent methods of contraception Female tubal ligation In this method, a woman's fallopian tubes are sealed, tied, or blocked during surgery to prevent eggs from traveling to the uterus. Hysteroscopic sterilization In this method, a small, flexible insert is placed into each fallopian tube. The inserts cause scar tissue to form in the fallopian tubes and block them, so sperm cannot reach an egg. The procedure takes about 3 months to be effective. Another form of birth control must be used during those 3 months. Female sterilization This is a procedure to tie off the tubes that carry sperm (vasectomy). After the procedure, the man can still ejaculate fluid (semen). Natural planning methods Natural family planning In this method, a couple does not have sex on days when the woman could become pregnant. Calendar method This means keeping track of the length of each menstrual cycle, identifying the days when pregnancy can happen, and not having sex on those days. Ovulation method In this method, a couple avoids sex during ovulation. Symptothermal method This method involves not having sex during ovulation. The woman typically checks for ovulation by watching changes in her temperature and in the consistency of cervical mucus. Post-ovulation method In this method, a couple waits to have sex until after ovulation. Summary  Contraception, also called birth control, means methods or devices that prevent pregnancy.  Hormonal methods of contraception include implants, injections, pills, patches, vaginal rings, and emergency contraceptives.  Barrier methods of contraception can include female condoms, female condoms, diaphragms, cervical caps, sponges, and spermicides.  There are two types of IUDs (intrauterine devices). An IUD can be put in a woman's uterus to  prevent pregnancy for 3-5 years.  Permanent sterilization can be done through a procedure for males, females, or both.  Natural family planning methods involve not having sex on days when the woman could become pregnant. This information is not intended to replace advice given to you by your health care provider. Make sure you discuss any questions you have with your health care provider. Document Revised: 04/10/2017 Document Reviewed: 05/11/2016 Elsevier Patient Education  2020 [employer].   Maternity Assessment Unit (MAU)  The Maternity Assessment Unit (MAU) is located at the [facility] at [facility]. The address is: [address], [place], [place], [place] [address]. Please see map below for additional directions.    The Maternity Assessment Unit is designed to help you during your pregnancy, and for up to 6 weeks after delivery, with any pregnancy- or postpartum-related emergencies, if you think you are in labor, or if your water has broken. For example, if you experience nausea and vomiting, vaginal bleeding, severe abdominal or pelvic pain, elevated blood pressure or other problems related to your pregnancy or postpartum time, please come to the Maternity Assessment Unit for assistance.  Preterm Labor and Birth Information  The normal length of a pregnancy is 39-41 weeks. Preterm labor is when labor starts before 44  completed weeks of pregnancy. What are the risk factors for preterm labor? Preterm labor is more likely to occur in women who:  Have certain infections during pregnancy such as a bladder infection, sexually transmitted infection, or infection inside the uterus (chorioamnionitis).  Have a shorter-than-normal cervix.  Have gone into preterm labor before.  Have had surgery on their cervix.  Are younger than age 25 or older than age 58.  Are African American.  Are pregnant with twins or multiple babies (multiple gestation).   Take street drugs or smoke while pregnant.  Do not gain enough weight while pregnant.  Became pregnant shortly after having been pregnant. What are the symptoms of preterm labor? Symptoms of preterm labor include:  Cramps similar to those that can happen during a menstrual period. The cramps may happen with diarrhea.  Pain in the abdomen or lower back.  Regular uterine contractions that may feel like tightening of the abdomen.  A feeling of increased pressure in the pelvis.  Increased watery or bloody mucus discharge from the vagina.  Water breaking (ruptured amniotic sac). Why is it important to recognize signs of preterm labor? It is important to recognize signs of preterm labor because babies who are born prematurely may not be fully developed. This can put them at an increased risk for:  Long-term (chronic) heart and lung problems.  Difficulty immediately after birth with regulating body systems, including blood sugar, body temperature, heart rate, and breathing rate.  Bleeding in the brain.  Cerebral palsy.  Learning difficulties.  Death. These risks are highest for babies who are born before 63 weeks of pregnancy. How is preterm labor treated? Treatment depends on the length of your pregnancy, your condition, and the health of your baby. It may involve:  Having a stitch (suture) placed in your cervix to prevent your cervix from opening too early (cerclage).  Taking or being given medicines, such as: ? Hormone medicines. These may be given early in pregnancy to help support the pregnancy. ? Medicine to stop contractions. ? Medicines to help mature the baby's lungs. These may be prescribed if the risk of delivery is high. ? Medicines to prevent your baby from developing cerebral palsy. If the labor happens before 34 weeks of pregnancy, you may need to stay in the hospital. What should I do if I think I am in preterm labor? If you think that you are going into preterm  labor, call your health care provider right away. How can I prevent preterm labor in future pregnancies? To increase your chance of having a full-term pregnancy:  Do not use any tobacco products, such as cigarettes, chewing tobacco, and e-cigarettes. If you need help quitting, ask your health care provider.  Do not use street drugs or medicines that have not been prescribed to you during your pregnancy.  Talk with your health care provider before taking any herbal supplements, even if you have been taking them regularly.  Make sure you gain a healthy amount of weight during your pregnancy.  Watch for infection. If you think that you might have an infection, get it checked right away.  Make sure to tell your health care provider if you have gone into preterm labor before. This information is not intended to replace advice given to you by your health care provider. Make sure you discuss any questions you have with your health care provider. Document Revised: 07/31/2018 Document Reviewed: 08/30/2015 Elsevier Patient Education  [place].  Group B Streptococcus Test During  Pregnancy Why am I having this test? Routine testing, also called screening, for group B streptococcus (GBS) is recommended for all pregnant women between the 36th and 37th week of pregnancy. GBS is a type of bacteria that can be passed from mother to baby during childbirth. Screening will help guide whether or not you will need treatment during labor and delivery to prevent complications such as:  An infection in your uterus during labor.  An infection in your uterus after delivery.  A serious infection in your baby after delivery, such as pneumonia, meningitis, or sepsis. GBS screening is not often done before 36 weeks of pregnancy unless you go into labor prematurely. What happens if I have group B streptococcus? If testing shows that you have GBS, your health care provider will recommend treatment with IV  antibiotics during labor and delivery. This treatment significantly decreases the risk of complications for you and your baby. If you have a planned C-section and you have GBS, you may not need to be treated with antibiotics because GBS is usually passed to babies after labor starts and your water breaks. If you are in labor or your water breaks before your C-section, it is possible for GBS to get into your uterus and be passed to your baby, so you might need treatment. Is there a chance I may not need to be tested? You may not need to be tested for GBS if:  You have a urine test that shows GBS before 36 to 37 weeks.  You had a baby with GBS infection after a previous delivery. In these cases, you will automatically be treated for GBS during labor and delivery. What is being tested? This test is done to check if you have group B streptococcus in your vagina or rectum. What kind of sample is taken? To collect samples for this test, your health care provider will swab your vagina and rectum with a cotton swab. The sample is then sent to the lab to see if GBS is present. What happens during the test?   You will remove your clothing from the waist down.  You will lie down on an exam table in the same position as you would for a pelvic exam.  Your health care provider will swab your vagina and rectum to collect samples for a culture test.  You will be able to go home after the test and do all your usual activities. How are the results reported? The test results are reported as positive or negative. What do the results mean?  A positive test means you are at risk for passing GBS to your baby during labor and delivery. Your health care provider will recommend that you are treated with an IV antibiotic during labor and delivery.  A negative test means you are at very low risk of passing GBS to your baby. There is still a low risk of passing GBS to your baby because sometimes test results may  report that you do not have a condition when you do (false-negative result) or there is a chance that you may become infected with GBS after the test is done. You most likely will not need to be treated with an antibiotic during labor and delivery. Talk with your health care provider about what your results mean. Questions to ask your health care provider Ask your health care provider, or the department that is doing the test:  When will my results be ready?  How will I get my results?  What are my treatment options? Summary  Routine testing (screening) for group B streptococcus (GBS) is recommended for all pregnant women between the 36th and 37th week of pregnancy.  GBS is a type of bacteria that can be passed from mother to baby during childbirth.  If testing shows that you have GBS, your health care provider will recommend that you are treated with IV antibiotics during labor and delivery. This treatment almost always prevents infection in newborns. This information is not intended to replace advice given to you by your health care provider. Make sure you discuss any questions you have with your health care provider. Document Revised: 07/30/2018 Document Reviewed: 05/06/2018 Elsevier Patient Education  2020 [employer].

## 2019-05-10 ENCOUNTER — Encounter: Payer: Self-pay | Admitting: Obstetrics and Gynecology

## 2019-05-14 ENCOUNTER — Ambulatory Visit (INDEPENDENT_AMBULATORY_CARE_PROVIDER_SITE_OTHER): Payer: Medicaid Other | Admitting: Obstetrics and Gynecology

## 2019-05-14 ENCOUNTER — Other Ambulatory Visit (HOSPITAL_COMMUNITY)
Admission: RE | Admit: 2019-05-14 | Discharge: 2019-05-14 | Disposition: A | Discharge status: Home / Self Care | Payer: Medicaid Other | Source: Ambulatory Visit | Attending: Obstetrics and Gynecology | Admitting: Obstetrics and Gynecology

## 2019-05-14 ENCOUNTER — Other Ambulatory Visit: Payer: Self-pay

## 2019-05-14 VITALS — BP 119/76 | HR 94 | Wt 183.2 lb

## 2019-05-14 DIAGNOSIS — Z3490 Encounter for supervision of normal pregnancy, unspecified, unspecified trimester: Secondary | ICD-10-CM

## 2019-05-14 DIAGNOSIS — Z3A36 36 weeks gestation of pregnancy: Secondary | ICD-10-CM

## 2019-05-14 DIAGNOSIS — Z3493 Encounter for supervision of normal pregnancy, unspecified, third trimester: Secondary | ICD-10-CM

## 2019-05-14 NOTE — Progress Notes (Signed)
   PRENATAL VISIT NOTE  Subjective:  Sheila [name] is a 34 y.o. G3P2002 at [redacted]w[redacted]d being seen today for ongoing prenatal care.  She is currently monitored for the following issues for this low-risk pregnancy and has AMA (advanced maternal age) multigravida 35+; Asthma; Late prenatal care; Supervision of other normal pregnancy, antepartum; and Cystic fibrosis carrier on their problem list.  Patient reports backache and occasional abdominal cramps.  Contractions: Not present. Vag. Bleeding: None.  Movement: Present. Denies leaking of fluid.   The following portions of the patient's history were reviewed and updated as appropriate: allergies, current medications, past family history, past medical history, past social history, past surgical history and problem list.   Objective:   Vitals:   05/14/19 1108  BP: 119/76  Pulse: 94  Weight: 183 lb 3.2 oz (83.1 kg)    Fetal Status: Fetal Heart Rate (bpm): 145 Fundal Height: 34 cm Movement: Present     General:  Alert, oriented and cooperative. Patient is in no acute distress.  Skin: Skin is warm and dry. No rash noted.   Cardiovascular: Normal heart rate noted  Respiratory: Normal respiratory effort, no problems with respiration noted  Abdomen: Soft, gravid, appropriate for gestational age.  Pain/Pressure: Absent     Pelvic: Cervical exam performed Dilation: Closed      Extremities: Normal range of motion.  Edema: None  Mental Status: Normal mood and affect. Normal behavior. Normal judgment and thought content.   Assessment and Plan:  Pregnancy: G3P2002 at [redacted]w[redacted]d 1. Encounter for supervision of low-risk pregnancy, antepartum Patient feels well with some lower back pain and intermitant abdominal cramps every few days that improves with Tylenol. - GC/Chlamydia swab - GBS culture - F/u in 1 week  2. Multigravida of advanced maternal age in third trimester  3. Atypical squamous cell changes of undetermined significance (ASCUS) on cervical  cytology with negative high risk human papilloma virus (HPV) test result -needs repeat Pap post-partum or 07/2019 at latest  4. Uncomplicated asthma, unspecified asthma severity, unspecified whether persistent -pt reports has inhaler at home, no exacerbations, last used 3 days ago and worked  5. Cystic fibrosis carrier -Received genetic counseling on 04/01/2019 -partner tested negative  Preterm labor symptoms and general obstetric precautions including but not limited to vaginal bleeding, contractions, leaking of fluid and fetal movement were reviewed in detail with the patient. Please refer to After Visit Summary for other counseling recommendations.   No follow-ups on file.  Future Appointments  Date Time Provider Department Center  05/21/2019 10:30 AM Marny Lowenstein, PA-C [facility] None    Marjie Skiff, Medical Student

## 2019-05-14 NOTE — Progress Notes (Deleted)
   PRENATAL VISIT NOTE  Subjective:  Sheila [name] is a 34 y.o. G3P2002 at [redacted]w[redacted]d being seen today for ongoing prenatal care.  She is currently monitored for the following issues for this {Blank single:19197::"high-risk","low-risk"} pregnancy and has AMA (advanced maternal age) multigravida 35+; Asthma; Late prenatal care; Supervision of other normal pregnancy, antepartum; and Cystic fibrosis carrier on their problem list.  Patient reports {sx:[id number]}.  Contractions: Not present. Vag. Bleeding: None.  Movement: Present. Denies leaking of fluid.   The following portions of the patient's history were reviewed and updated as appropriate: allergies, current medications, past family history, past medical history, past social history, past surgical history and problem list.   Objective:   Vitals:   05/14/19 1108  BP: 119/76  Pulse: 94  Weight: 183 lb 3.2 oz (83.1 kg)    Fetal Status: Fetal Heart Rate (bpm): 145   Movement: Present     General:  Alert, oriented and cooperative. Patient is in no acute distress.  Skin: Skin is warm and dry. No rash noted.   Cardiovascular: Normal heart rate noted  Respiratory: Normal respiratory effort, no problems with respiration noted  Abdomen: Soft, gravid, appropriate for gestational age.  Pain/Pressure: Absent     Pelvic: {Blank single:19197::"Cervical exam performed","Cervical exam deferred"}        Extremities: Normal range of motion.  Edema: None  Mental Status: Normal mood and affect. Normal behavior. Normal judgment and thought content.   Assessment and Plan:  Pregnancy: G3P2002 at [redacted]w[redacted]d There are no diagnoses linked to this encounter. {Blank single:19197::"Term","Preterm"} labor symptoms and general obstetric precautions including but not limited to vaginal bleeding, contractions, leaking of fluid and fetal movement were reviewed in detail with the patient. Please refer to After Visit Summary for other counseling recommendations.   No  follow-ups on file.  No future appointments.  Venia Carbon, NP

## 2019-05-14 NOTE — Progress Notes (Signed)
Pt presents for ROB and GBS cultures. Pt has no concerns

## 2019-05-16 LAB — STREP GP B NAA: Strep Gp B NAA: POSITIVE — AB

## 2019-05-17 LAB — CERVICOVAGINAL ANCILLARY ONLY
Chlamydia: NEGATIVE
Comment: NEGATIVE
Comment: NORMAL
Neisseria Gonorrhea: NEGATIVE

## 2019-05-21 ENCOUNTER — Ambulatory Visit (INDEPENDENT_AMBULATORY_CARE_PROVIDER_SITE_OTHER): Payer: Medicaid Other | Admitting: Medical

## 2019-05-21 ENCOUNTER — Other Ambulatory Visit: Payer: Self-pay

## 2019-05-21 ENCOUNTER — Encounter: Payer: Self-pay | Admitting: Medical

## 2019-05-21 VITALS — BP 121/71 | HR 91 | Wt 190.0 lb

## 2019-05-21 DIAGNOSIS — O09293 Supervision of pregnancy with other poor reproductive or obstetric history, third trimester: Secondary | ICD-10-CM

## 2019-05-21 DIAGNOSIS — Z141 Cystic fibrosis carrier: Secondary | ICD-10-CM

## 2019-05-21 DIAGNOSIS — J4520 Mild intermittent asthma, uncomplicated: Secondary | ICD-10-CM

## 2019-05-21 DIAGNOSIS — Z8619 Personal history of other infectious and parasitic diseases: Secondary | ICD-10-CM | POA: Insufficient documentation

## 2019-05-21 DIAGNOSIS — Z3480 Encounter for supervision of other normal pregnancy, unspecified trimester: Secondary | ICD-10-CM

## 2019-05-21 DIAGNOSIS — Z3A37 37 weeks gestation of pregnancy: Secondary | ICD-10-CM

## 2019-05-21 DIAGNOSIS — O0930 Supervision of pregnancy with insufficient antenatal care, unspecified trimester: Secondary | ICD-10-CM

## 2019-05-21 DIAGNOSIS — O09523 Supervision of elderly multigravida, third trimester: Secondary | ICD-10-CM

## 2019-05-21 DIAGNOSIS — O0933 Supervision of pregnancy with insufficient antenatal care, third trimester: Secondary | ICD-10-CM

## 2019-05-21 NOTE — Progress Notes (Signed)
   PRENATAL VISIT NOTE  Subjective:  Sheila [name] is a 34 y.o. G3P2002 at [redacted]w[redacted]d being seen today for ongoing prenatal care.  She is currently monitored for the following issues for this low-risk pregnancy and has AMA (advanced maternal age) multigravida 35+; Asthma; Late prenatal care; Supervision of other normal pregnancy, antepartum; Cystic fibrosis carrier; and Group B streptococcal infection in child of prior pregnancy, currently pregnant in third trimester on their problem list.  Patient reports no complaints.  Contractions: Irregular. Vag. Bleeding: None.  Movement: Present. Denies leaking of fluid.   The following portions of the patient's history were reviewed and updated as appropriate: allergies, current medications, past family history, past medical history, past social history, past surgical history and problem list.   Objective:   Vitals:   05/21/19 1040  BP: 121/71  Pulse: 91  Weight: 190 lb (86.2 kg)    Fetal Status: Fetal Heart Rate (bpm): 145 Fundal Height: 36 cm Movement: Present     General:  Alert, oriented and cooperative. Patient is in no acute distress.  Skin: Skin is warm and dry. No rash noted.   Cardiovascular: Normal heart rate noted  Respiratory: Normal respiratory effort, no problems with respiration noted  Abdomen: Soft, gravid, appropriate for gestational age.  Pain/Pressure: Absent     Pelvic: Cervical exam deferred        Extremities: Normal range of motion.  Edema: None  Mental Status: Normal mood and affect. Normal behavior. Normal judgment and thought content.   Assessment and Plan:  Pregnancy: G3P2002 at [redacted]w[redacted]d 1. Group B streptococcal infection in child of prior pregnancy, currently pregnant in third trimester - Treat in labor  2. Supervision of other normal pregnancy, antepartum - Doing well, only irregular contractions  - Discussed planned MOC today, considering POPs   3. Multigravida of advanced maternal age in third trimester - <  40, no change   4. Late prenatal care  5. Mild intermittent asthma, unspecified whether complicated - Using inhaler PRN   Term labor symptoms and general obstetric precautions including but not limited to vaginal bleeding, contractions, leaking of fluid and fetal movement were reviewed in detail with the patient. Please refer to After Visit Summary for other counseling recommendations.   Return in about 1 week (around 05/28/2019) for LOB, In-Person.  No future appointments.  Vonzella Nipple, PA-C

## 2019-05-21 NOTE — Progress Notes (Signed)
Pt states no concerns today. Having occ ctx.

## 2019-05-21 NOTE — Patient Instructions (Signed)
Fetal Movement Counts Patient Name: ________________________________________________ Patient Due Date: ____________________ What is a fetal movement count?  A fetal movement count is the number of times that you feel your baby move during a certain amount of time. This may also be called a fetal kick count. A fetal movement count is recommended for every pregnant woman. You may be asked to start counting fetal movements as early as week 28 of your pregnancy. Pay attention to when your baby is most active. You may notice your baby's sleep and wake cycles. You may also notice things that make your baby move more. You should do a fetal movement count:  When your baby is normally most active.  At the same time each day. A good time to count movements is while you are resting, after having something to eat and drink. How do I count fetal movements? 1. Find a quiet, comfortable area. Sit, or lie down on your side. 2. Write down the date, the start time and stop time, and the number of movements that you felt between those two times. Take this information with you to your health care visits. 3. Write down your start time when you feel the first movement. 4. Count kicks, flutters, swishes, rolls, and jabs. You should feel at least 10 movements. 5. You may stop counting after you have felt 10 movements, or if you have been counting for 2 hours. Write down the stop time. 6. If you do not feel 10 movements in 2 hours, contact your health care provider for further instructions. Your health care provider may want to do additional tests to assess your baby's well-being. Contact a health care provider if:  You feel fewer than 10 movements in 2 hours.  Your baby is not moving like he or she usually does. Date: ____________ Start time: ____________ Stop time: ____________ Movements: ____________ Date: ____________ Start time: ____________ Stop time: ____________ Movements: ____________ Date: ____________  Start time: ____________ Stop time: ____________ Movements: ____________ Date: ____________ Start time: ____________ Stop time: ____________ Movements: ____________ Date: ____________ Start time: ____________ Stop time: ____________ Movements: ____________ Date: ____________ Start time: ____________ Stop time: ____________ Movements: ____________ Date: ____________ Start time: ____________ Stop time: ____________ Movements: ____________ Date: ____________ Start time: ____________ Stop time: ____________ Movements: ____________ Date: ____________ Start time: ____________ Stop time: ____________ Movements: ____________ This information is not intended to replace advice given to you by your health care provider. Make sure you discuss any questions you have with your health care provider. Document Revised: 11/26/2018 Document Reviewed: 11/26/2018 Elsevier Patient Education  2020 [employer] Braxton Hicks Contractions Contractions of the uterus can occur throughout pregnancy, but they are not always a sign that you are in labor. You may have practice contractions called Braxton Hicks contractions. These false labor contractions are sometimes confused with true labor. What are Braxton Hicks contractions? Braxton Hicks contractions are tightening movements that occur in the muscles of the uterus before labor. Unlike true labor contractions, these contractions do not result in opening (dilation) and thinning of the cervix. Toward the end of pregnancy (32-34 weeks), Braxton Hicks contractions can happen more often and may become stronger. These contractions are sometimes difficult to tell apart from true labor because they can be very uncomfortable. You should not feel embarrassed if you go to the hospital with false labor. Sometimes, the only way to tell if you are in true labor is for your health care provider to look for changes in the cervix. The health care provider   will do a physical exam and may  monitor your contractions. If you are not in true labor, the exam should show that your cervix is not dilating and your water has not broken. If there are no other health problems associated with your pregnancy, it is completely safe for you to be sent home with false labor. You may continue to have Braxton Hicks contractions until you go into true labor. How to tell the difference between true labor and false labor True labor  Contractions last 30-70 seconds.  Contractions become very regular.  Discomfort is usually felt in the top of the uterus, and it spreads to the lower abdomen and low back.  Contractions do not go away with walking.  Contractions usually become more intense and increase in frequency.  The cervix dilates and gets thinner. False labor  Contractions are usually shorter and not as strong as true labor contractions.  Contractions are usually irregular.  Contractions are often felt in the front of the lower abdomen and in the groin.  Contractions may go away when you walk around or change positions while lying down.  Contractions get weaker and are shorter-lasting as time goes on.  The cervix usually does not dilate or become thin. Follow these instructions at home:   Take over-the-counter and prescription medicines only as told by your health care provider.  Keep up with your usual exercises and follow other instructions from your health care provider.  Eat and drink lightly if you think you are going into labor.  If Braxton Hicks contractions are making you uncomfortable: ? Change your position from lying down or resting to walking, or change from walking to resting. ? Sit and rest in a tub of warm water. ? Drink enough fluid to keep your urine pale yellow. Dehydration may cause these contractions. ? Do slow and deep breathing several times an hour.  Keep all follow-up prenatal visits as told by your health care provider. This is important. Contact a  health care provider if:  You have a fever.  You have continuous pain in your abdomen. Get help right away if:  Your contractions become stronger, more regular, and closer together.  You have fluid leaking or gushing from your vagina.  You pass blood-tinged mucus (bloody show).  You have bleeding from your vagina.  You have low back pain that you never had before.  You feel your baby's head pushing down and causing pelvic pressure.  Your baby is not moving inside you as much as it used to. Summary  Contractions that occur before labor are called Braxton Hicks contractions, false labor, or practice contractions.  Braxton Hicks contractions are usually shorter, weaker, farther apart, and less regular than true labor contractions. True labor contractions usually become progressively stronger and regular, and they become more frequent.  Manage discomfort from Braxton Hicks contractions by changing position, resting in a warm bath, drinking plenty of water, or practicing deep breathing. This information is not intended to replace advice given to you by your health care provider. Make sure you discuss any questions you have with your health care provider. Document Revised: 03/21/2017 Document Reviewed: 08/22/2016 Elsevier Patient Education  2020 Elsevier Inc.  

## 2019-05-27 ENCOUNTER — Ambulatory Visit (INDEPENDENT_AMBULATORY_CARE_PROVIDER_SITE_OTHER): Payer: Medicaid Other | Admitting: Advanced Practice Midwife

## 2019-05-27 ENCOUNTER — Other Ambulatory Visit: Payer: Self-pay

## 2019-05-27 VITALS — BP 124/79 | HR 92 | Wt 188.0 lb

## 2019-05-27 DIAGNOSIS — Z3A38 38 weeks gestation of pregnancy: Secondary | ICD-10-CM

## 2019-05-27 DIAGNOSIS — Z3480 Encounter for supervision of other normal pregnancy, unspecified trimester: Secondary | ICD-10-CM

## 2019-05-27 DIAGNOSIS — O09293 Supervision of pregnancy with other poor reproductive or obstetric history, third trimester: Secondary | ICD-10-CM

## 2019-05-27 DIAGNOSIS — O09523 Supervision of elderly multigravida, third trimester: Secondary | ICD-10-CM

## 2019-05-27 NOTE — Patient Instructions (Signed)

## 2019-05-27 NOTE — Progress Notes (Signed)
ROB   CC: None    

## 2019-05-27 NOTE — Progress Notes (Signed)
   PRENATAL VISIT NOTE  Subjective:  Sheila [name] is a 34 y.o. G3P2002 at [redacted]w[redacted]d being seen today for ongoing prenatal care.  She is currently monitored for the following issues for this low-risk pregnancy and has AMA (advanced maternal age) multigravida 35+; Asthma; Late prenatal care; Supervision of other normal pregnancy, antepartum; Cystic fibrosis carrier; and Group B streptococcal infection in child of prior pregnancy, currently pregnant in third trimester on their problem list.  Patient reports no complaints.  Contractions: Not present. Vag. Bleeding: None.  Movement: Present. Denies leaking of fluid.   The following portions of the patient's history were reviewed and updated as appropriate: allergies, current medications, past family history, past medical history, past social history, past surgical history and problem list. Problem list updated.  Objective:   Vitals:   05/27/19 1126  BP: 124/79  Pulse: 92  Weight: 188 lb (85.3 kg)    Fetal Status: Fetal Heart Rate (bpm): 138 Fundal Height: 38 cm Movement: Present  Presentation: Vertex  General:  Alert, oriented and cooperative. Patient is in no acute distress.  Skin: Skin is warm and dry. No rash noted.   Cardiovascular: Normal heart rate noted  Respiratory: Normal respiratory effort, no problems with respiration noted  Abdomen: Soft, gravid, appropriate for gestational age.  Pain/Pressure: Absent     Pelvic: Cervical exam deferred        Extremities: Normal range of motion.  Edema: None  Mental Status: Normal mood and affect. Normal behavior. Normal judgment and thought content.   Assessment and Plan:  Pregnancy: G3P2002 at [redacted]w[redacted]d  1. Supervision of other normal pregnancy, antepartum - No concerning findings today, continue routine care - Confirmed location of MAU for labor check PRN - Discussed membrane sweeping today for next week PRN. Reviewed cochrane review data on membrane sweeping at 39 wks and then at EDD.  Reviewed risk of cramping, contractions, bleeding and ROM. Answered patient questions and she agreed to proceed if appropriate at next visit.   2. Multigravida of advanced maternal age in third trimester - age 56, no change indicated  3. Group B streptococcal infection in child of prior pregnancy, currently pregnant in third trimester - Treatment in labor, reviewed with patient today  Term labor symptoms and general obstetric precautions including but not limited to vaginal bleeding, contractions, leaking of fluid and fetal movement were reviewed in detail with the patient. Please refer to After Visit Summary for other counseling recommendations.  Return in about 1 week (around 06/03/2019) for 39 week ROB.  Future Appointments  Date Time Provider Department Center  06/03/2019  2:40 PM Gerrit Heck, CNM [facility] None    [place], [place]

## 2019-06-03 ENCOUNTER — Other Ambulatory Visit: Payer: Self-pay

## 2019-06-03 ENCOUNTER — Ambulatory Visit (INDEPENDENT_AMBULATORY_CARE_PROVIDER_SITE_OTHER): Payer: Medicaid Other

## 2019-06-03 DIAGNOSIS — Z3480 Encounter for supervision of other normal pregnancy, unspecified trimester: Secondary | ICD-10-CM

## 2019-06-03 DIAGNOSIS — Z3483 Encounter for supervision of other normal pregnancy, third trimester: Secondary | ICD-10-CM

## 2019-06-03 DIAGNOSIS — Z3A39 39 weeks gestation of pregnancy: Secondary | ICD-10-CM

## 2019-06-03 NOTE — Progress Notes (Signed)
   LOW-RISK PREGNANCY OFFICE VISIT  Patient name: Sheila [name] MRN [id number]  Date of birth: 21-Dec-1985 Chief Complaint:   Routine Prenatal Visit   Subjective:   Sheila [name] is a 34 y.o. G56P2002 female at [redacted]w[redacted]d with an Estimated Date of Delivery: 06/08/19 being seen today for ongoing management of a low-risk pregnancy. Today she reports no complaints. Contractions: Irregular. Vag. Bleeding: None.  Movement: Present. She denies any leaking of fluid.  She requests that her IOL be scheduled for Sunday Feb 21st in the morning if possible.  Reviewed past medical,surgical, social, obstetrical and family history as well as problem list, medications and allergies.  Objective   Vitals:   06/03/19 1416  BP: 119/80  Pulse: 82  Weight: 188 lb 8 oz (85.5 kg)  Body mass index is 30.42 kg/m.        Physical Examination:   General appearance: Well appearing, and in no distress  Mental status: Alert, oriented to person, place, and time  Skin: Warm & dry  Cardiovascular: Normal heart rate noted  Respiratory: Normal respiratory effort, no distress  Abdomen: Soft, gravid, nontender, AGA with  Fundal Height: 37 cm  Pelvic: Cervical exam deferred per patient request          Extremities: Edema: None  Fetal Status: Fetal Heart Rate (bpm): 155  Movement: Present   No results found for this or any previous visit (from the past 24 hour(s)).  Assessment & Plan:  Low-risk pregnancy of a 34 y.o., G3P2002 at [redacted]w[redacted]d with an Estimated Date of Delivery: 06/08/19   1. Supervision of other normal pregnancy, antepartum -Reviewed IOL including cytotec, foley bulb placement, and pitocin. -Discussed that IOL could be scheduled for requested date if slots open. -Informed that she would need to come to the office next week for visit with NST. -Discussed possible cervical exam at that time to assess Bishop Score and prepare for induction.  -Reviewed Positive GBS and need for treatment in labor. -IOL  scheduled on Feb 21 at 0700. Orders placed.    Meds: No orders of the defined types were placed in this encounter.  Labs/procedures today:  Lab Orders  No laboratory test(s) ordered today     Reviewed: Term labor symptoms and general obstetric precautions including but not limited to vaginal bleeding, contractions, leaking of fluid and fetal movement were reviewed in detail with the patient.  All questions were answered.  Follow-up: Return in about 1 week (around 06/10/2019) for LR-ROB with NST.  No orders of the defined types were placed in this encounter.  Cherre Robins MSN, CNM 06/03/2019

## 2019-06-03 NOTE — Progress Notes (Signed)
Induction Assessment Scheduling Form: [facility]ax to [facility]:  [phone]  Sheila [name]                                                                                   DOB:  [birth year]                                                            MRN:  [id number]                                                                     Phone #:   [phone]                         Provider:  Haskel Khan  GP:  [id number]                                                            Estimated Date of Delivery: 06/08/19  Dating Criteria: LMP    Medical Indications for induction:  AMA, PostDates Admission Date/Time:  Jun 13, 2019 Gestational age on admission:  40.4 weeks   Filed Weights   06/03/19 1416  Weight: 188 lb 8 oz (85.5 kg)   HIV:  Non Reactive (12/01 1035) GBS: --[name] (01/22 1141)  Bishop score: TBD   Method of induction(proposed):  Cytotec/Foley Bulb   Scheduling Provider Signature:  Cherre Robins, CNM                                            Today's Date:  06/03/2019

## 2019-06-03 NOTE — Patient Instructions (Signed)

## 2019-06-03 NOTE — Progress Notes (Signed)
Pt presents for ROB w/o complaints Pt declines cx check today.

## 2019-06-04 ENCOUNTER — Telehealth (HOSPITAL_COMMUNITY): Payer: Self-pay | Admitting: *Deleted

## 2019-06-04 NOTE — Telephone Encounter (Signed)
Preadmission screen Interpreter number [phone]

## 2019-06-07 ENCOUNTER — Telehealth (HOSPITAL_COMMUNITY): Payer: Self-pay | Admitting: *Deleted

## 2019-06-07 NOTE — Telephone Encounter (Signed)
Interpreter number [phone]

## 2019-06-08 ENCOUNTER — Telehealth (HOSPITAL_COMMUNITY): Payer: Self-pay | Admitting: *Deleted

## 2019-06-08 NOTE — Telephone Encounter (Signed)
Preadmission screen Interpreter number [phone]

## 2019-06-09 ENCOUNTER — Ambulatory Visit (INDEPENDENT_AMBULATORY_CARE_PROVIDER_SITE_OTHER): Payer: Medicaid Other | Admitting: Advanced Practice Midwife

## 2019-06-09 ENCOUNTER — Other Ambulatory Visit: Payer: Self-pay

## 2019-06-09 ENCOUNTER — Other Ambulatory Visit: Payer: Self-pay | Admitting: Advanced Practice Midwife

## 2019-06-09 VITALS — BP 127/83 | HR 86 | Wt 191.2 lb

## 2019-06-09 DIAGNOSIS — O09523 Supervision of elderly multigravida, third trimester: Secondary | ICD-10-CM

## 2019-06-09 DIAGNOSIS — Z3480 Encounter for supervision of other normal pregnancy, unspecified trimester: Secondary | ICD-10-CM

## 2019-06-09 DIAGNOSIS — Z3A40 40 weeks gestation of pregnancy: Secondary | ICD-10-CM

## 2019-06-09 DIAGNOSIS — O480 Post-term pregnancy: Secondary | ICD-10-CM

## 2019-06-09 NOTE — Progress Notes (Signed)
Pt is here for ROB and NST. [redacted]w[redacted]d. Pt is scheduled for induction on 06/13/19.

## 2019-06-09 NOTE — Progress Notes (Signed)
   PRENATAL VISIT NOTE  Subjective:  Sheila [name] is a 34 y.o. G3P2002 at [redacted]w[redacted]d being seen today for ongoing prenatal care.  She is currently monitored for the following issues for this low-risk pregnancy and has AMA (advanced maternal age) multigravida 35+; Asthma; Late prenatal care; Supervision of other normal pregnancy, antepartum; Cystic fibrosis carrier; and Group B streptococcal infection in child of prior pregnancy, currently pregnant in third trimester on their problem list.  Patient reports no complaints.  Contractions: Irregular. Vag. Bleeding: None.  Movement: Present. Denies leaking of fluid.   The following portions of the patient's history were reviewed and updated as appropriate: allergies, current medications, past family history, past medical history, past social history, past surgical history and problem list.   Objective:   Vitals:   06/09/19 1330  BP: 127/83  Pulse: 86  Weight: 191 lb 3.2 oz (86.7 kg)    Fetal Status: Fetal Heart Rate (bpm): NST   Movement: Present     General:  Alert, oriented and cooperative. Patient is in no acute distress.  Skin: Skin is warm and dry. No rash noted.   Cardiovascular: Normal heart rate noted  Respiratory: Normal respiratory effort, no problems with respiration noted  Abdomen: Soft, gravid, appropriate for gestational age.  Pain/Pressure: Present     Pelvic: Cervical exam deferred        Extremities: Normal range of motion.  Edema: None  Mental Status: Normal mood and affect. Normal behavior. Normal judgment and thought content.   Assessment and Plan:  Pregnancy: G3P2002 at [redacted]w[redacted]d 1. Supervision of other normal pregnancy, antepartum --Anticipatory guidance about next visits/weeks of pregnancy given. --IOL scheduled in 5 days, COVID testing in 3 days, discussed with pt today. --Reviewed labor precautions/reasons to come to MAU --Pt declined exam/membrane sweep today - Fetal nonstress test.  NST reactive today.  2. Post  term pregnancy over 40 weeks --NST reactive  Term labor symptoms and general obstetric precautions including but not limited to vaginal bleeding, contractions, leaking of fluid and fetal movement were reviewed in detail with the patient. Please refer to After Visit Summary for other counseling recommendations.   No follow-ups on file.  Future Appointments  Date Time Provider Department Center  06/11/2019  9:45 AM [facility] None  06/13/2019  8:40 AM [facility] SCHED ROOM [facility] None    Sharen Counter, CNM

## 2019-06-09 NOTE — Patient Instructions (Signed)
Labor Precautions Reasons to come to MAU at [facility]:  1.  Contractions are  5 minutes apart or less, each last 1 minute, these have been going on for 1-2 hours, and you cannot walk or talk during them 2.  You have a large gush of fluid, or a trickle of fluid that will not stop and you have to wear a pad 3.  You have bleeding that is bright red, heavier than spotting--like menstrual bleeding (spotting can be normal in early labor or after a check of your cervix) 4.  You do not feel the baby moving like he/she normally does  

## 2019-06-10 ENCOUNTER — Encounter: Payer: Medicaid Other | Admitting: Advanced Practice Midwife

## 2019-06-11 ENCOUNTER — Other Ambulatory Visit (HOSPITAL_COMMUNITY): Admission: RE | Admit: 2019-06-11 | Payer: Medicaid Other | Source: Ambulatory Visit

## 2019-06-11 ENCOUNTER — Encounter (HOSPITAL_COMMUNITY): Payer: Self-pay | Admitting: Obstetrics and Gynecology

## 2019-06-11 ENCOUNTER — Inpatient Hospital Stay (HOSPITAL_COMMUNITY)
Admission: AD | Admit: 2019-06-11 | Discharge: 2019-06-13 | DRG: 807 | Disposition: A | Discharge status: Home / Self Care | Payer: Medicaid Other | Attending: Obstetrics and Gynecology | Admitting: Obstetrics and Gynecology

## 2019-06-11 DIAGNOSIS — O99824 Streptococcus B carrier state complicating childbirth: Secondary | ICD-10-CM | POA: Diagnosis present

## 2019-06-11 DIAGNOSIS — Z3A40 40 weeks gestation of pregnancy: Secondary | ICD-10-CM | POA: Diagnosis not present

## 2019-06-11 DIAGNOSIS — Z141 Cystic fibrosis carrier: Secondary | ICD-10-CM | POA: Diagnosis not present

## 2019-06-11 DIAGNOSIS — Z20822 Contact with and (suspected) exposure to covid-19: Secondary | ICD-10-CM | POA: Diagnosis present

## 2019-06-11 DIAGNOSIS — O9952 Diseases of the respiratory system complicating childbirth: Secondary | ICD-10-CM | POA: Diagnosis present

## 2019-06-11 DIAGNOSIS — J45909 Unspecified asthma, uncomplicated: Secondary | ICD-10-CM | POA: Diagnosis present

## 2019-06-11 DIAGNOSIS — O321XX0 Maternal care for breech presentation, not applicable or unspecified: Secondary | ICD-10-CM | POA: Diagnosis present

## 2019-06-11 DIAGNOSIS — O26893 Other specified pregnancy related conditions, third trimester: Secondary | ICD-10-CM | POA: Diagnosis present

## 2019-06-11 LAB — COMPREHENSIVE METABOLIC PANEL
ALT: 11 U/L (ref 0–44)
AST: 24 U/L (ref 15–41)
Albumin: 2.9 g/dL — ABNORMAL LOW (ref 3.5–5.0)
Alkaline Phosphatase: 192 U/L — ABNORMAL HIGH (ref 38–126)
Anion gap: 12 (ref 5–15)
BUN: 6 mg/dL (ref 6–20)
CO2: 18 mmol/L — ABNORMAL LOW (ref 22–32)
Calcium: 9.4 mg/dL (ref 8.9–10.3)
Chloride: 103 mmol/L (ref 98–111)
Creatinine, Ser: 0.56 mg/dL (ref 0.44–1.00)
GFR calc Af Amer: >60 mL/min (ref >60)
GFR calc non Af Amer: >60 mL/min (ref >60)
Glucose, Bld: 122 mg/dL — ABNORMAL HIGH (ref 70–99)
Potassium: 4 mmol/L (ref 3.5–5.1)
Sodium: 133 mmol/L — ABNORMAL LOW (ref 135–145)
Total Bilirubin: 0.4 mg/dL (ref 0.3–1.2)
Total Protein: 6.9 g/dL (ref 6.5–8.1)

## 2019-06-11 LAB — TYPE AND SCREEN
ABO/RH(D): A POS
Antibody Screen: NEGATIVE

## 2019-06-11 LAB — ABO/RH: ABO/RH(D): A POS

## 2019-06-11 LAB — CBC
HCT: 35.8 % — ABNORMAL LOW (ref 36.0–46.0)
Hemoglobin: 11.6 g/dL — ABNORMAL LOW (ref 12.0–15.0)
MCH: 28.9 pg (ref 26.0–34.0)
MCHC: 32.4 g/dL (ref 30.0–36.0)
MCV: 89.1 fL (ref 80.0–100.0)
Platelets: 273 10*3/uL (ref 150–400)
RBC: 4.02 MIL/uL (ref 3.87–5.11)
RDW: 14.4 % (ref 11.5–15.5)
WBC: 11.3 10*3/uL — ABNORMAL HIGH (ref 4.0–10.5)
nRBC: 0 % (ref 0.0–0.2)

## 2019-06-11 LAB — RPR: RPR Ser Ql: NONREACTIVE

## 2019-06-11 LAB — PROTEIN / CREATININE RATIO, URINE
Creatinine, Urine: 50.13 mg/dL
Protein Creatinine Ratio: 0.16 mg/mg{Cre} — ABNORMAL HIGH (ref 0.00–0.15)
Total Protein, Urine: 8 mg/dL

## 2019-06-11 LAB — RESPIRATORY PANEL BY RT PCR (FLU A&B, COVID)
Influenza A by PCR: NEGATIVE
Influenza B by PCR: NEGATIVE
SARS Coronavirus 2 by RT PCR: NEGATIVE

## 2019-06-11 MED ORDER — PRENATAL MULTIVITAMIN CH
1.0000 | ORAL_TABLET | Freq: Every day | ORAL | Status: DC
  Administered 2019-06-11 – 2019-06-13 (×3): 1 via ORAL
  Filled 2019-06-11 (×3): qty 1

## 2019-06-11 MED ORDER — OXYCODONE HCL 5 MG PO TABS
10.0000 mg | ORAL_TABLET | ORAL | Status: DC | PRN
  Filled 2019-06-11: qty 2

## 2019-06-11 MED ORDER — LIDOCAINE HCL (PF) 1 % IJ SOLN
5.0000 mL | Freq: Once | INTRAMUSCULAR | Status: AC
  Administered 2019-06-11: 5 mL

## 2019-06-11 MED ORDER — DIBUCAINE (PERIANAL) 1 % EX OINT: 1.0000 "application " | TOPICAL_OINTMENT | CUTANEOUS | Status: DC | PRN

## 2019-06-11 MED ORDER — ONDANSETRON HCL 4 MG PO TABS: 4.0000 mg | ORAL_TABLET | ORAL | Status: DC | PRN

## 2019-06-11 MED ORDER — ACETAMINOPHEN 325 MG PO TABS
650.0000 mg | ORAL_TABLET | ORAL | Status: DC | PRN
  Administered 2019-06-11 – 2019-06-13 (×5): 650 mg via ORAL
  Filled 2019-06-11 (×4): qty 2

## 2019-06-11 MED ORDER — IBUPROFEN 600 MG PO TABS
600.0000 mg | ORAL_TABLET | Freq: Four times a day (QID) | ORAL | Status: DC
  Administered 2019-06-11 – 2019-06-13 (×11): 600 mg via ORAL
  Filled 2019-06-11 (×11): qty 1

## 2019-06-11 MED ORDER — ONDANSETRON HCL 4 MG/2ML IJ SOLN: 4.0000 mg | INTRAMUSCULAR | Status: DC | PRN

## 2019-06-11 MED ORDER — OXYTOCIN 10 UNIT/ML IJ SOLN
INTRAMUSCULAR | Status: AC
  Filled 2019-06-11: qty 1

## 2019-06-11 MED ORDER — DIPHENHYDRAMINE HCL 25 MG PO CAPS: 25.0000 mg | ORAL_CAPSULE | Freq: Four times a day (QID) | ORAL | Status: DC | PRN

## 2019-06-11 MED ORDER — LIDOCAINE HCL (PF) 1 % IJ SOLN
INTRAMUSCULAR | Status: AC
  Filled 2019-06-11: qty 5

## 2019-06-11 MED ORDER — BENZOCAINE-MENTHOL 20-0.5 % EX AERO
1.0000 "application " | INHALATION_SPRAY | CUTANEOUS | Status: DC | PRN
  Administered 2019-06-11 – 2019-06-13 (×2): 1 via TOPICAL
  Filled 2019-06-11 (×2): qty 56

## 2019-06-11 MED ORDER — TETANUS-DIPHTH-ACELL PERTUSSIS 5-2.5-18.5 LF-MCG/0.5 IM SUSP: 0.5000 mL | Freq: Once | INTRAMUSCULAR | Status: DC

## 2019-06-11 MED ORDER — SIMETHICONE 80 MG PO CHEW: 80.0000 mg | CHEWABLE_TABLET | ORAL | Status: DC | PRN

## 2019-06-11 MED ORDER — OXYTOCIN 10 UNIT/ML IJ SOLN
10.0000 [IU] | Freq: Once | INTRAMUSCULAR | Status: AC
  Administered 2019-06-11: 10 [IU] via INTRAMUSCULAR

## 2019-06-11 MED ORDER — ZOLPIDEM TARTRATE 5 MG PO TABS: 5.0000 mg | ORAL_TABLET | Freq: Every evening | ORAL | Status: DC | PRN

## 2019-06-11 MED ORDER — COCONUT OIL OIL: 1.0000 "application " | TOPICAL_OIL | Status: DC | PRN

## 2019-06-11 MED ORDER — ERYTHROMYCIN 5 MG/GM OP OINT
TOPICAL_OINTMENT | OPHTHALMIC | Status: AC
  Filled 2019-06-11: qty 1

## 2019-06-11 MED ORDER — WITCH HAZEL-GLYCERIN EX PADS: 1.0000 "application " | MEDICATED_PAD | CUTANEOUS | Status: DC | PRN

## 2019-06-11 MED ORDER — SENNOSIDES-DOCUSATE SODIUM 8.6-50 MG PO TABS
2.0000 | ORAL_TABLET | ORAL | Status: DC
  Administered 2019-06-12 – 2019-06-13 (×2): 2 via ORAL
  Filled 2019-06-11 (×2): qty 2

## 2019-06-11 MED ORDER — OXYCODONE HCL 5 MG PO TABS
5.0000 mg | ORAL_TABLET | ORAL | Status: DC | PRN
  Administered 2019-06-11 (×2): 5 mg via ORAL
  Filled 2019-06-11 (×2): qty 1

## 2019-06-11 NOTE — MAU Note (Signed)
Covid swab obtained without difficulty and pt tol well. NO symptoms

## 2019-06-11 NOTE — Lactation Note (Signed)
This note was copied from a baby's chart. Lactation Consultation Note  Patient Name: Sheila [name] JMEQA'S Date: 06/11/2019 Reason for consult: Initial assessment;Term P3, 5 hour female term infant. Infant had two voids, one that LC changed while in room. [place] interpreter used # [phone] Aram Candela. Per mom, she breastfed her 1st child for one year and 2nd child who is 2 year 4 months for 15 months. Mom is active on the [facility] program in [facility] and would like a hand pump. LC gave mom hand pump and explained how to use. Mom's feeding choice is breast and formula feeding infant. Per mom, she been feeding infant short intervals 2 to 3 minutes, LC notice mom had infant wrapped in layers while feeding, LC discussed breastfeeding infant STS, keeping infant stimulated to breastfed for longer duration, rubbing infant check and neck. LC gave mom pillow to help with breastfeeding she was trying breastfeed infant bent over in bed with no support for her self nor infant, infant appeared be on her nipple tip with shallow latch. Mom latched infant on right breast using the cross cradle hold, nose and chin touching breast, Dad voiced he could tell a difference and reassured mom that infant could breathe, infant breastfed for 20 minutes.  LC discussed breastfeeding and supplementing with formula, LC gave mom both sheets due mom wanting supplement sometimes after breastfeeding and then sometimes formula feed infant only, interpreter help explain the differences to mom and dad. Mom will breastfeed infant according to hunger cues, 8 to 12 times within 24 hours, on demand and not exceed 3 hours without breastfeeding infant. Parents will continue to do as much STS as possible. Mom knows to ask for RN or LC if she needs any assistance with latching infant at breast.  Reviewed Baby & Me book's Breastfeeding Basics.  Mom made aware of O/P services, breastfeeding support groups, community resources, and our  phone # for post-discharge questions.   Maternal Data Formula Feeding for Exclusion: Yes Reason for exclusion: Mother's choice to formula and breast feed on admission Has patient been taught Hand Expression?: Yes Does the patient have breastfeeding experience prior to this delivery?: Yes  Feeding Feeding Type: Breast Fed  LATCH Score Latch: Grasps breast easily, tongue down, lips flanged, rhythmical sucking.  Audible Swallowing: Spontaneous and intermittent  Type of Nipple: Everted at rest and after stimulation  Comfort (Breast/Nipple): Soft / non-tender  Hold (Positioning): Assistance needed to correctly position infant at breast and maintain latch.  LATCH Score: 9  Interventions Interventions: Breast feeding basics reviewed;Breast compression;Assisted with latch;Adjust position;Skin to skin;Support pillows;Breast massage;Position options;Hand express;Expressed milk;Hand pump  Lactation Tools Discussed/Used WIC Program: Yes Pump Review: Setup, frequency, and cleaning;Milk Storage Initiated by:: Danelle Earthly, IBCLC Date initiated:: 06/11/19   Consult Status Consult Status: Follow-up Date: 06/11/19 Follow-up type: In-patient    Danelle Earthly 06/11/2019, 7:03 AM
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
## 2019-06-11 NOTE — Discharge Summary (Signed)
Postpartum Discharge Summary  Date of Service updated 06/13/2019     Patient Name: Sheila Sheila [name] Sheila [name] DOB: [birth year] MRN: [id number]  Date of admission: 06/11/2019 Delivering Provider: Merilyn Baba   Date of discharge: 06/13/2019  Admitting diagnosis: Normal labor and delivery [O80] Intrauterine pregnancy: [redacted]w[redacted]d    Secondary diagnosis:  Active Problems:   Normal labor and delivery  Additional problems: Precipitous labor     Discharge diagnosis: Term Pregnancy Delivered                                                                                                Post partum procedures: NA  Augmentation: AROM  Complications: None  Hospital course:  Onset of Labor With Vaginal Delivery     34y.o. yo G3P3003 at 470w3das admitted in Active Labor on 06/11/2019. Patient had an uncomplicated labor course as follows:   She arrived in MAU complete and pushing. AROM was performed and she delivered shortly after.  Membrane Rupture Time/Date: 1:07 AM ,06/11/2019   Intrapartum Procedures: Episiotomy: None [1]                                         Lacerations:  1st degree [2];Perineal [11]  Patient had a delivery of a Viable infant. 06/11/2019  Information for the patient's newborn:  Sheila Sheila [name], Sheila Sheila [name] Sheila [name][[id number]]Delivery Method: Vaginal, Spontaneous(Filed from Delivery Summary)     Pateint had an uncomplicated postpartum course.  She is ambulating, tolerating a regular diet, passing flatus, and urinating well. Patient is discharged home in stable condition on 06/13/19.  Delivery time: 1:11 AM    Magnesium Sulfate received: No BMZ received: No Rhophylac:No MMR:No Transfusion:No  Physical exam  Vitals:   06/12/19 0525 06/12/19 1457 06/12/19 1924 06/13/19 0532  BP: 107/67 118/64 124/82 125/80  Pulse: 83 89 80 79  Resp: _0 Temp: 98 F (36.7 C) 98.5 F (36.9 C) 98 F (36.7 C) 98.2 F (36.8 C)  TempSrc:  Oral Oral Oral  SpO2: 98%  100% 100%    General: alert, cooperative and no distress Lochia: appropriate Uterine Fundus: firm Incision: N/A DVT Evaluation: No evidence of DVT seen on physical exam. Labs: Lab Results  Component Value Date   WBC 11.3 (H) 06/11/2019   HGB 11.6 (L) 06/11/2019   HCT 35.8 (L) 06/11/2019   MCV 89.1 06/11/2019   PLT 273 06/11/2019   CMP Latest Ref Rng & Units 06/11/2019  Glucose 70 - 99 mg/dL 122(H)  BUN 6 - 20 mg/dL 6  Creatinine 0.44 - 1.00 mg/dL 0.56  Sodium 135 - 145 mmol/L 133(L)  Potassium 3.5 - 5.1 mmol/L 4.0  Chloride 98 - 111 mmol/L 103  CO2 22 - 32 mmol/L 18(L)  Calcium 8.9 - 10.3 mg/dL 9.4  Total Protein 6.5 - 8.1 g/dL 6.9  Total Bilirubin 0.3 - 1.2 mg/dL 0.4  Alkaline Phos 38 - 126 U/L 192(H)  AST 15 - 41 U/L 24  ALT 0 - 44  U/L 11   Edinburgh Score: Edinburgh Postnatal Depression Scale Screening Tool 06/11/2019  Sheila [name] have been able to laugh and see the funny side of things. (No Data)  Sheila [name] have looked forward with enjoyment to things. -  Sheila [name] have blamed myself unnecessarily when things went wrong. -  Sheila [name] have been anxious or worried for no good reason. -  Sheila [name] have felt scared or panicky for no good reason. -  Things have been getting on top of me. -  Sheila [name] have been so unhappy that Sheila [name] have had difficulty sleeping. -  Sheila [name] have felt sad or miserable. -  Sheila [name] have been so unhappy that Sheila [name] have been crying. -  The thought of harming myself has occurred to me. Sheila Sheila [name] Sheila [name] Postnatal Depression Scale Total -    Discharge instruction: per After Visit Summary and "Baby and Me Booklet".  After visit meds:  Allergies as of 06/13/2019   No Known Allergies     Medication List    STOP taking these medications   aspirin EC 81 MG tablet     TAKE these medications   acetaminophen 500 MG tablet Commonly known as: TYLENOL Take 500 mg by mouth every 6 (six) hours as needed.   albuterol 108 (90 Base) MCG/ACT inhaler Commonly known as: VENTOLIN HFA Inhale 2 puffs into the lungs every 6 (six) hours  as needed for wheezing or shortness of breath.   Blood Pressure Kit Devi 1 Device by Does not apply route as needed.   Dermoplast 20-0.5 % Aero Generic drug: benzocaine-Menthol Apply 1 application topically 4 (four) times daily as needed for irritation.   diphenhydrAMINE 25 MG tablet Commonly known as: BENADRYL Take 1 tablet (25 mg total) by mouth every 6 (six) hours as needed.   ibuprofen 600 MG tablet Commonly known as: ADVIL Take 1 tablet (600 mg total) by mouth every 6 (six) hours. What changed:   when to take this  reasons to take this   norethindrone 0.35 MG tablet Commonly known as: MICRONOR Take 1 tablet (0.35 mg total) by mouth daily.   polyethylene glycol 17 g packet Commonly known as: MIRALAX / GLYCOLAX Take 17 g by mouth daily.   PRENATAL VITAMINS PO Take 1 tablet by mouth daily.   senna-docusate 8.6-50 MG tablet Commonly known as: Senokot-S Take 2 tablets by mouth at bedtime as needed for mild constipation.   Vitafol Ultra 29-0.6-0.4-200 MG Caps Take 1 tablet by mouth daily.       Diet: routine diet  Activity: Advance as tolerated. Pelvic rest for 6 weeks.   Outpatient follow up:4 weeks Follow up Appt: Future Appointments  Date Time Provider [place]  07/08/2019  9:00 AM Anyanwu, Sallyanne Havers, MD [facility] None   Follow up Visit: Please schedule this patient for Postpartum visit in: 4 weeks with the following provider: Any provider For C/S patients schedule nurse incision check in weeks 2 weeks: no High risk pregnancy complicated by: AMA, late prenatal care, asthma, cystic fibrosis carrier Delivery mode:  SVD Anticipated Birth Control:  POPs PP Procedures needed: None  Schedule Integrated BH visit: no  Newborn Data: Live born female  Birth Weight: 7 lb 10.8 oz (3480 g) APGAR: 9, 9  Newborn Delivery   Birth date/time: 06/11/2019 01:11:00 Delivery type: Vaginal, Spontaneous      Baby Feeding: Breast Disposition:home with  mother   Sheila Sheila [name] Saupe I, NP 06/13/2019 12:50 PM

## 2019-06-11 NOTE — Progress Notes (Signed)
RN offered to use interpreter to communicate with patient and support person. Pt denied use.

## 2019-06-12 NOTE — Progress Notes (Signed)
Patient ID: Sheila [name], female   DOB: [birth year], 34 y.o.   MRN: [id number]  POSTPARTUM PROGRESS NOTE  Post Partum Day 1  Subjective:  Sheila [name] is a 34 y.o. D6U4403 s/p SVD at [redacted]w[redacted]d.  No acute events overnight.  Pt denies problems with ambulating, voiding or po intake.  She denies nausea or vomiting.  Pain is well controlled.  She has had flatus. She has had bowel movement.  Lochia Minimal.   Objective: Blood pressure 124/82, pulse 80, temperature 98 F (36.7 C), temperature source Oral, resp. rate 16, last menstrual period 09/01/2018, SpO2 100 %, unknown if currently breastfeeding.  Physical Exam:  General: alert, cooperative and no distress Chest: no respiratory distress Heart:regular rate, distal pulses intact Abdomen: soft, nontender,  Uterine Fundus: firm, appropriately tender DVT Evaluation: No calf swelling or tenderness Extremities: No edema Skin: warm, dry, and intact  Recent Labs    06/11/19 0249  HGB 11.6*  HCT 35.8*    Assessment/Plan: Sheila [name] is a 34 y.o. [id number] s/p NSVD at [redacted]w[redacted]d   PPD#1 - Doing well Contraception: PoPs Feeding: both Dispo: Plan for discharge .   LOS: 1 day   Raelyn Mora, CNM, CNM 06/12/2019, 12:40 PM

## 2019-06-13 ENCOUNTER — Inpatient Hospital Stay (HOSPITAL_COMMUNITY): Payer: Medicaid Other

## 2019-06-13 ENCOUNTER — Inpatient Hospital Stay (HOSPITAL_COMMUNITY)
Admission: AD | Admit: 2019-06-13 | Payer: Medicaid Other | Source: Home / Self Care | Admitting: Obstetrics and Gynecology

## 2019-06-13 MED ORDER — NORETHINDRONE 0.35 MG PO TABS: 1.0000 | ORAL_TABLET | Freq: Every day | ORAL | 11 refills | Status: DC

## 2019-06-13 MED ORDER — POLYETHYLENE GLYCOL 3350 17 G PO PACK: 17.0000 g | PACK | Freq: Every day | ORAL | 0 refills | Status: DC

## 2019-06-13 MED ORDER — IBUPROFEN 600 MG PO TABS: 600.0000 mg | ORAL_TABLET | Freq: Four times a day (QID) | ORAL | 0 refills | Status: DC

## 2019-06-13 MED ORDER — DERMOPLAST 20-0.5 % EX AERO: 1.0000 "application " | INHALATION_SPRAY | Freq: Four times a day (QID) | CUTANEOUS | 1 refills | Status: DC | PRN

## 2019-06-13 NOTE — Lactation Note (Signed)
This note was copied from a baby's chart. Lactation Consultation Note  Patient Name: Sheila [name] [id number]'[id number] Date: 06/13/2019 Reason for consult: Follow-up assessment;Term  I followed up with Ms. [name] to check on her progress with breast feeding. She states that her milk is transitioning and that baby "Sheila [name]" is latching well. She cluster fed last night. Baby was sleeping in her lap upon entry. Ms. [name] states that she just fed at the breast. Patient denies pain with latch.  I reviewed feeding frequency, output expectations and signs that baby is getting enough to eat. I conducted lactation discharge education including review of how to manage engorgement and how to reach out for lactation community resources.   I recommended continuing to breast feed baby on demand 8-12 times a day. I encouraged her to call for support after discharge using the resources provided.  Ms. [name] will be following up with [facility] on Tuesday. All questions answered at this time.   Maternal Data Formula Feeding for Exclusion: No Does the patient have breastfeeding experience prior to this delivery?: Yes   Interventions Interventions: Breast feeding basics reviewed  Consult Status Consult Status: Complete    Walker Shadow 06/13/2019, 12:31 PM

## 2019-06-13 NOTE — Discharge Instructions (Signed)
Postpartum Care After Vaginal Delivery This sheet gives you information about how to care for yourself from the time you deliver your baby to up to 6-12 weeks after delivery (postpartum period). Your health care provider may also give you more specific instructions. If you have problems or questions, contact your health care provider. Follow these instructions at home: Vaginal bleeding  It is normal to have vaginal bleeding (lochia) after delivery. Wear a sanitary pad for vaginal bleeding and discharge. ? During the first week after delivery, the amount and appearance of lochia is often similar to a menstrual period. ? Over the next few weeks, it will gradually decrease to a dry, yellow-brown discharge. ? For most women, lochia stops completely by 4-6 weeks after delivery. Vaginal bleeding can vary from woman to woman.  Change your sanitary pads frequently. Watch for any changes in your flow, such as: ? A sudden increase in volume. ? A change in color. ? Large blood clots.  If you pass a blood clot from your vagina, save it and call your health care provider to discuss. Do not flush blood clots down the toilet before talking with your health care provider.  Do not use tampons or douches until your health care provider says this is safe.  If you are not breastfeeding, your period should return 6-8 weeks after delivery. If you are feeding your child breast milk only (exclusive breastfeeding), your period may not return until you stop breastfeeding. Perineal care  Keep the area between the vagina and the anus (perineum) clean and dry as told by your health care provider. Use medicated pads and pain-relieving sprays and creams as directed.  If you had a cut in the perineum (episiotomy) or a tear in the vagina, check the area for signs of infection until you are healed. Check for: ? More redness, swelling, or pain. ? Fluid or blood coming from the cut or tear. ? Warmth. ? Pus or a bad  smell.  You may be given a squirt bottle to use instead of wiping to clean the perineum area after you go to the bathroom. As you start healing, you may use the squirt bottle before wiping yourself. Make sure to wipe gently.  To relieve pain caused by an episiotomy, a tear in the vagina, or swollen veins in the anus (hemorrhoids), try taking a warm sitz bath 2-3 times a day. A sitz bath is a warm water bath that is taken while you are sitting down. The water should only come up to your hips and should cover your buttocks. Breast care  Within the first few days after delivery, your breasts may feel heavy, full, and uncomfortable (breast engorgement). Milk may also leak from your breasts. Your health care provider can suggest ways to help relieve the discomfort. Breast engorgement should go away within a few days.  If you are breastfeeding: ? Wear a bra that supports your breasts and fits you well. ? Keep your nipples clean and dry. Apply creams and ointments as told by your health care provider. ? You may need to use breast pads to absorb milk that leaks from your breasts. ? You may have uterine contractions every time you breastfeed for up to several weeks after delivery. Uterine contractions help your uterus return to its normal size. ? If you have any problems with breastfeeding, work with your health care provider or lactation consultant.  If you are not breastfeeding: ? Avoid touching your breasts a lot. Doing this can make   your breasts produce more milk. ? Wear a good-fitting bra and use cold packs to help with swelling. ? Do not squeeze out (express) milk. This causes you to make more milk. Intimacy and sexuality  Ask your health care provider when you can engage in sexual activity. This may depend on: ? Your risk of infection. ? How fast you are healing. ? Your comfort and desire to engage in sexual activity.  You are able to get pregnant after delivery, even if you have not had  your period. If desired, talk with your health care provider about methods of birth control (contraception). Medicines  Take over-the-counter and prescription medicines only as told by your health care provider.  If you were prescribed an antibiotic medicine, take it as told by your health care provider. Do not stop taking the antibiotic even if you start to feel better. Activity  Gradually return to your normal activities as told by your health care provider. Ask your health care provider what activities are safe for you.  Rest as much as possible. Try to rest or take a nap while your baby is sleeping. Eating and drinking   Drink enough fluid to keep your urine pale yellow.  Eat high-fiber foods every day. These may help prevent or relieve constipation. High-fiber foods include: ? Whole grain cereals and breads. ? Brown rice. ? Beans. ? Fresh fruits and vegetables.  Do not try to lose weight quickly by cutting back on calories.  Take your prenatal vitamins until your postpartum checkup or until your health care provider tells you it is okay to stop. Lifestyle  Do not use any products that contain nicotine or tobacco, such as cigarettes and e-cigarettes. If you need help quitting, ask your health care provider.  Do not drink alcohol, especially if you are breastfeeding. General instructions  Keep all follow-up visits for you and your baby as told by your health care provider. Most women visit their health care provider for a postpartum checkup within the first 3-6 weeks after delivery. Contact a health care provider if:  You feel unable to cope with the changes that your child brings to your life, and these feelings do not go away.  You feel unusually sad or worried.  Your breasts become red, painful, or hard.  You have a fever.  You have trouble holding urine or keeping urine from leaking.  You have little or no interest in activities you used to enjoy.  You have not  breastfed at all and you have not had a menstrual period for 12 weeks after delivery.  You have stopped breastfeeding and you have not had a menstrual period for 12 weeks after you stopped breastfeeding.  You have questions about caring for yourself or your baby.  You pass a blood clot from your vagina. Get help right away if:  You have chest pain.  You have difficulty breathing.  You have sudden, severe leg pain.  You have severe pain or cramping in your lower abdomen.  You bleed from your vagina so much that you fill more than one sanitary pad in one hour. Bleeding should not be heavier than your heaviest period.  You develop a severe headache.  You faint.  You have blurred vision or spots in your vision.  You have bad-smelling vaginal discharge.  You have thoughts about hurting yourself or your baby. If you ever feel like you may hurt yourself or others, or have thoughts about taking your own life, get help  right away. You can go to the nearest emergency department or call: °· Your local emergency services (911 in the [place] °· A suicide crisis helpline, such as the National Suicide Prevention Lifeline at 1-800-273-8255. This is open 24 hours a day. °Summary °· The period of time right after you deliver your newborn up to 6-12 weeks after delivery is called the postpartum period. °· Gradually return to your normal activities as told by your health care provider. °· Keep all follow-up visits for you and your baby as told by your health care provider. °This information is not intended to replace advice given to you by your health care provider. Make sure you discuss any questions you have with your health care provider. °Document Revised: 04/11/2017 Document Reviewed: 01/20/2017 °Elsevier Patient Education © 2020 [employer] ° ° ° °You have constipation which is hard stools that are difficult to pass. It is important to have regular bowel movements every 1-3 days that are soft and easy  to pass. Hard stools increase your risk of hemorrhoids and are very uncomfortable.  ° °To prevent constipation you can increase the amount of fiber in your diet. Examples of foods with fiber are leafy greens, whole grain breads, oatmeal and other grains.  It is also important to drink at least eight 8oz glass of water everyday.  ° °If you have not has a bowel movement in 4-5 days you made need to clean out your bowel.  This will have establish normal movement through your bowel.   ° °Miralax Clean out °· Take 8 capfuls of miralax in 64 oz of gatorade. You can use any fluid that appeals to you (gatorade, water, juice) °· Continue to drink at least eight 8 oz glasses of water throughout the day °· You can repeat with another 8 capfuls of miralax in 64 oz of gatorade if you are not having a large amount of stools °· You will need to be at home and close to a bathroom for about 8 hours when you do the above as you may need to go to the bathroom frequently.  ° °After you are cleaned out: °- Start Colace100mg twice daily °- Start Miralax once daily °- Start a daily fiber supplement like metamucil or citrucel °- You can safely use enemas in pregnancy  °- if you are having diarrhea you can reduce to Colace once a day or miralax every other day or a 1/2 capful daily.  ° °

## 2019-06-16 ENCOUNTER — Encounter: Payer: Medicaid Other | Admitting: Advanced Practice Midwife

## 2019-07-08 ENCOUNTER — Encounter: Payer: Self-pay | Admitting: Obstetrics & Gynecology

## 2019-07-08 ENCOUNTER — Telehealth (INDEPENDENT_AMBULATORY_CARE_PROVIDER_SITE_OTHER): Payer: Medicaid Other | Admitting: Obstetrics & Gynecology

## 2019-07-08 MED ORDER — IBUPROFEN 800 MG PO TABS: 800.0000 mg | ORAL_TABLET | Freq: Three times a day (TID) | ORAL | 3 refills | Status: DC | PRN

## 2019-07-08 NOTE — Progress Notes (Signed)
    Post Partum Visit Note  Sheila [name] is a 34 y.o. [id number] female who presents for a postpartum visit. She is 4 weeks postpartum following a normal spontaneous vaginal delivery.  I have fully reviewed the prenatal and intrapartum course. The delivery was at 40 3/7 gestational weeks.  Anesthesia: local. Postpartum course has been uncomplicated. Baby is doing well. Baby is feeding by breast. Bleeding staining only. Bowel function is normal. Bladder function is normal. Patient is sexually active. Contraception method is oral progesterone-only contraceptive. Postpartum depression screening: negative.  The following portions of the patient's history were reviewed and updated as appropriate: allergies, current medications, past family history, past medical history, past surgical history and problem list.  Review of Systems Pertinent items noted in HPI and remainder of comprehensive ROS otherwise negative.    Objective:  Blood pressure 118/79, pulse 76, weight 175 lb (79.4 kg), last menstrual period 09/01/2018, unknown if currently breastfeeding.  General:  alert and no distress   Breasts:  inspection negative, no nipple discharge or bleeding, no masses or nodularity palpable  Lungs: clear to auscultation bilaterally  Heart:  regular rate and rhythm, S1, S2 normal, no murmur, click, rub or gallop  Abdomen: soft, non-tender; bowel sounds normal; no masses,  no organomegaly  Pelvic:  not evaluated        Assessment:    Normal postpartum exam. Pap smear not done at today's visit.   Plan:   Essential components of care per ACOG  1.  Mood and well being: Patient with negative depression screening today. Reviewed local resources for support.  - Patient does not use tobacco. If using tobacco we discussed reduction and for recently cessation risk of relapse - hx of drug use? No   2. Infant care and feeding:  -Patient currently breastmilk feeding? yes.  If breastmilk feeding discussed  return to work and pumping. Reviewed importance of draining breast regularly to support lactation. -Social determinants of health (SDOH) reviewed in epic. The following needs were identified  3. Sexuality, contraception and birth spacing - Patient does not want a pregnancy in the next year.  Desired family size is 5 children.  - Reviewed forms of contraception in tiered fashion. Patient is on oral progesterone-only contraceptive today.   Reviewed need to switch to COC if breastfeeding is stopped. - Discussed birth spacing of 18 months  4. Sleep and fatigue -Encouraged family/partner/community support of 4 hrs of uninterrupted sleep to help with mood and fatigue  5. Physical Recovery  - Discussed patients delivery - Patient had a first degree laceration, perineal healing reviewed. Patient expressed understanding - Patient has urinary incontinence? No - Patient is safe to resume physical and sexual activity  6.  Health Maintenance - Last pap smear done 03/08/2019 and was normal with negative HRHPV  7. No Chronic Disease. Does not have a PCP and does not desire one at his point.    Jaynie Collins, MD, FACOG Obstetrician & Gynecologist, [facility] for [employer], [facility]

## 2019-12-02 ENCOUNTER — Ambulatory Visit (INDEPENDENT_AMBULATORY_CARE_PROVIDER_SITE_OTHER): Payer: Medicaid Other | Admitting: Family Medicine

## 2019-12-02 ENCOUNTER — Encounter: Payer: Self-pay | Admitting: Family Medicine

## 2019-12-02 ENCOUNTER — Other Ambulatory Visit (HOSPITAL_COMMUNITY)
Admission: RE | Admit: 2019-12-02 | Discharge: 2019-12-02 | Disposition: A | Discharge status: Home / Self Care | Payer: Medicaid Other | Source: Ambulatory Visit | Attending: Family Medicine | Admitting: Family Medicine

## 2019-12-02 ENCOUNTER — Other Ambulatory Visit: Payer: Self-pay

## 2019-12-02 VITALS — Wt 168.2 lb

## 2019-12-02 DIAGNOSIS — N898 Other specified noninflammatory disorders of vagina: Secondary | ICD-10-CM

## 2019-12-02 DIAGNOSIS — N912 Amenorrhea, unspecified: Secondary | ICD-10-CM

## 2019-12-02 DIAGNOSIS — M5442 Lumbago with sciatica, left side: Secondary | ICD-10-CM

## 2019-12-02 DIAGNOSIS — M5441 Lumbago with sciatica, right side: Secondary | ICD-10-CM

## 2019-12-02 LAB — POCT URINALYSIS DIPSTICK
Bilirubin, UA: NEGATIVE
Blood, UA: NEGATIVE
Glucose, UA: NEGATIVE
Ketones, UA: NEGATIVE
Leukocytes, UA: NEGATIVE
Nitrite, UA: NEGATIVE
Protein, UA: NEGATIVE
Spec Grav, UA: 1.01 (ref 1.010–1.025)
Urobilinogen, UA: 0.2 E.U./dL
pH, UA: 6.5 (ref 5.0–8.0)

## 2019-12-02 LAB — POCT URINE PREGNANCY: Preg Test, Ur: NEGATIVE

## 2019-12-02 MED ORDER — IBUPROFEN 800 MG PO TABS: 800.0000 mg | ORAL_TABLET | Freq: Three times a day (TID) | ORAL | 3 refills | Status: DC | PRN
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
## 2019-12-02 NOTE — Patient Instructions (Signed)
-exercises below for back pain -can take tylenol 1000 mg every 6-8 hours and ibuprofen 800 mg every 4-6 hours -recommend establish care with primary care physician -we will let you know if your lab results are abnormal  Sciatica Rehab Ask your health care provider which exercises are safe for you. Do exercises exactly as told by your health care provider and adjust them as directed. It is normal to feel mild stretching, pulling, tightness, or discomfort as you do these exercises. Stop right away if you feel sudden pain or your pain gets worse. Do not begin these exercises until told by your health care provider. Stretching and range-of-motion exercises These exercises warm up your muscles and joints and improve the movement and flexibility of your hips and back. These exercises also help to relieve pain, numbness, and tingling. Sciatic nerve glide 1. Sit in a chair with your head facing down toward your chest. Place your hands behind your back. Let your shoulders slump forward. 2. Slowly straighten one of your legs while you tilt your head back as if you are looking toward the ceiling. Only straighten your leg as far as you can without making your symptoms worse. 3. Hold this position for __________ seconds. 4. Slowly return to the starting position. 5. Repeat with your other leg. Repeat __________ times. Complete this exercise __________ times a day. Knee to chest with hip adduction and internal rotation  1. Lie on your back on a firm surface with both legs straight. 2. Bend one of your knees and move it up toward your chest until you feel a gentle stretch in your lower back and buttock. Then, move your knee toward the shoulder that is on the opposite side from your leg. This is hip adduction and internal rotation. ? Hold your leg in this position by holding on to the front of your knee. 3. Hold this position for __________ seconds. 4. Slowly return to the starting position. 5. Repeat with  your other leg. Repeat __________ times. Complete this exercise __________ times a day. Prone extension on elbows  1. Lie on your abdomen on a firm surface. A bed may be too soft for this exercise. 2. Prop yourself up on your elbows. 3. Use your arms to help lift your chest up until you feel a gentle stretch in your abdomen and your lower back. ? This will place some of your body weight on your elbows. If this is uncomfortable, try stacking pillows under your chest. ? Your hips should stay down, against the surface that you are lying on. Keep your hip and back muscles relaxed. 4. Hold this position for __________ seconds. 5. Slowly relax your upper body and return to the starting position. Repeat __________ times. Complete this exercise __________ times a day. Strengthening exercises These exercises build strength and endurance in your back. Endurance is the ability to use your muscles for a long time, even after they get tired. Pelvic tilt This exercise strengthens the muscles that lie deep in the abdomen. 1. Lie on your back on a firm surface. Bend your knees and keep your feet flat on the floor. 2. Tense your abdominal muscles. Tip your pelvis up toward the ceiling and flatten your lower back into the floor. ? To help with this exercise, you may place a small towel under your lower back and try to push your back into the towel. 3. Hold this position for __________ seconds. 4. Let your muscles relax completely before you repeat this exercise.  Repeat __________ times. Complete this exercise __________ times a day. Alternating arm and leg raises  1. Get on your hands and knees on a firm surface. If you are on a hard floor, you may want to use padding, such as an exercise mat, to cushion your knees. 2. Line up your arms and legs. Your hands should be directly below your shoulders, and your knees should be directly below your hips. 3. Lift your left leg behind you. At the same time, raise  your right arm and straighten it in front of you. ? Do not lift your leg higher than your hip. ? Do not lift your arm higher than your shoulder. ? Keep your abdominal and back muscles tight. ? Keep your hips facing the ground. ? Do not arch your back. ? Keep your balance carefully, and do not hold your breath. 4. Hold this position for __________ seconds. 5. Slowly return to the starting position. 6. Repeat with your right leg and your left arm. Repeat __________ times. Complete this exercise __________ times a day. Posture and body mechanics Good posture and healthy body mechanics can help to relieve stress in your body's tissues and joints. Body mechanics refers to the movements and positions of your body while you do your daily activities. Posture is part of body mechanics. Good posture means:  Your spine is in its natural S-curve position (neutral).  Your shoulders are pulled back slightly.  Your head is not tipped forward. Follow these guidelines to improve your posture and body mechanics in your everyday activities. Standing   When standing, keep your spine neutral and your feet about hip width apart. Keep a slight bend in your knees. Your ears, shoulders, and hips should line up.  When you do a task in which you stand in one place for a long time, place one foot up on a stable object that is 2-4 inches (5-10 cm) high, such as a footstool. This helps keep your spine neutral. Sitting   When sitting, keep your spine neutral and keep your feet flat on the floor. Use a footrest, if necessary, and keep your thighs parallel to the floor. Avoid rounding your shoulders, and avoid tilting your head forward.  When working at a desk or a computer, keep your desk at a height where your hands are slightly lower than your elbows. Slide your chair under your desk so you are close enough to maintain good posture.  When working at a computer, place your monitor at a height where you are looking  straight ahead and you do not have to tilt your head forward or downward to look at the screen. Resting  When lying down and resting, avoid positions that are most painful for you.  If you have pain with activities such as sitting, bending, stooping, or squatting, lie in a position in which your body does not bend very much. For example, avoid curling up on your side with your arms and knees near your chest (fetal position).  If you have pain with activities such as standing for a long time or reaching with your arms, lie with your spine in a neutral position and bend your knees slightly. Try the following positions: ? Lying on your side with a pillow between your knees. ? Lying on your back with a pillow under your knees. Lifting   When lifting objects, keep your feet at least shoulder width apart and tighten your abdominal muscles.  Bend your knees and hips and keep  your spine neutral. It is important to lift using the strength of your legs, not your back. Do not lock your knees straight out.  Always ask for help to lift heavy or awkward objects. This information is not intended to replace advice given to you by your health care provider. Make sure you discuss any questions you have with your health care provider. Document Revised: 07/31/2018 Document Reviewed: 04/30/2018 Elsevier Patient Education  [place].

## 2019-12-02 NOTE — Progress Notes (Signed)
Pt reports that she has been experiencing lower back and leg pain since February after the delivery of her daughter. Pt also reports she has been experiencing white vaginal discharge for the last 2 weeks, denies itching or odor.

## 2019-12-03 LAB — CERVICOVAGINAL ANCILLARY ONLY
Bacterial Vaginitis (gardnerella): POSITIVE — AB
Candida Glabrata: NEGATIVE
Candida Vaginitis: NEGATIVE
Chlamydia: NEGATIVE
Comment: NEGATIVE
Comment: NEGATIVE
Comment: NEGATIVE
Comment: NEGATIVE
Comment: NEGATIVE
Comment: NORMAL
Neisseria Gonorrhea: NEGATIVE
Trichomonas: NEGATIVE

## 2019-12-05 ENCOUNTER — Other Ambulatory Visit: Payer: Self-pay | Admitting: Family Medicine

## 2019-12-05 DIAGNOSIS — B9689 Other specified bacterial agents as the cause of diseases classified elsewhere: Secondary | ICD-10-CM

## 2019-12-05 DIAGNOSIS — N760 Acute vaginitis: Secondary | ICD-10-CM

## 2019-12-05 MED ORDER — METRONIDAZOLE 500 MG PO TABS: 500.0000 mg | ORAL_TABLET | Freq: Two times a day (BID) | ORAL | 0 refills | Status: AC

## 2019-12-05 NOTE — Progress Notes (Signed)
Rx for flagyl sent to pharmacy.

## 2020-11-14 ENCOUNTER — Other Ambulatory Visit: Payer: Self-pay | Admitting: Family

## 2020-11-14 DIAGNOSIS — N644 Mastodynia: Secondary | ICD-10-CM

## 2020-11-14 DIAGNOSIS — N63 Unspecified lump in unspecified breast: Secondary | ICD-10-CM

## 2020-12-05 ENCOUNTER — Other Ambulatory Visit: Payer: Self-pay

## 2020-12-05 ENCOUNTER — Ambulatory Visit
Admission: RE | Admit: 2020-12-05 | Discharge: 2020-12-05 | Disposition: A | Discharge status: Home / Self Care | Payer: Medicaid Other | Source: Ambulatory Visit | Attending: Family | Admitting: Family

## 2020-12-05 ENCOUNTER — Ambulatory Visit: Payer: Medicaid Other

## 2020-12-05 DIAGNOSIS — N644 Mastodynia: Secondary | ICD-10-CM

## 2020-12-05 DIAGNOSIS — N63 Unspecified lump in unspecified breast: Secondary | ICD-10-CM

## 2021-03-14 ENCOUNTER — Other Ambulatory Visit: Payer: Self-pay

## 2021-03-14 ENCOUNTER — Ambulatory Visit (INDEPENDENT_AMBULATORY_CARE_PROVIDER_SITE_OTHER): Payer: Medicaid Other

## 2021-03-14 VITALS — BP 124/74 | HR 81 | Ht 66.0 in | Wt 160.0 lb

## 2021-03-14 DIAGNOSIS — N912 Amenorrhea, unspecified: Secondary | ICD-10-CM | POA: Diagnosis not present

## 2021-03-14 DIAGNOSIS — Z3480 Encounter for supervision of other normal pregnancy, unspecified trimester: Secondary | ICD-10-CM | POA: Insufficient documentation

## 2021-03-14 DIAGNOSIS — O219 Vomiting of pregnancy, unspecified: Secondary | ICD-10-CM

## 2021-03-14 HISTORY — DX: Encounter for supervision of other normal pregnancy, unspecified trimester: Z34.80

## 2021-03-14 LAB — POCT URINE PREGNANCY: Preg Test, Ur: POSITIVE — AB

## 2021-03-14 MED ORDER — VITAFOL ULTRA 29-0.6-0.4-200 MG PO CAPS: 1.0000 | ORAL_CAPSULE | Freq: Every day | ORAL | 3 refills | Status: AC

## 2021-03-14 MED ORDER — PROMETHAZINE HCL 25 MG PO TABS: 25.0000 mg | ORAL_TABLET | Freq: Four times a day (QID) | ORAL | 2 refills | Status: DC | PRN

## 2021-03-14 NOTE — Progress Notes (Signed)
[name] presents today for UPT. She has no unusual complaints. LMP: 02/03/21 ???    OBJECTIVE: Appears well, in no apparent distress.  OB History     Gravida  3   Para  3   Term  3   Preterm  0   AB  0   Living  3      SAB  0   IAB  0   Ectopic  0   Multiple  0   Live Births  3          Home UPT Result: positive In-Office UPT result:positive I have reviewed the patient's medical, obstetrical, social, and family histories, and medications.   ASSESSMENT: Positive pregnancy test  PLAN Prenatal care to be completed at: [facility] Patient states that she will be leaving the country on 12/5 and will return in mid February. States that she will receive care in [place] while she is away. Patient advised to bring any records from [place] with her when she returns.   Patient advised to call and schedule NEW OB before she leaves. Prenatal vitamins sent to pharmacy Phenergan sent to pharmacy

## 2021-03-22 ENCOUNTER — Other Ambulatory Visit: Payer: Self-pay | Admitting: *Deleted

## 2021-03-22 MED ORDER — BLOOD PRESSURE MONITOR KIT: 1.0000 | PACK | Freq: Once | 0 refills | Status: AC

## 2021-03-22 NOTE — Progress Notes (Signed)
Pt called stating she needs Rx for BP cuff.  Pt states previous cuff(2020) no longer works. New Rx sent today.

## 2021-04-22 NOTE — L&D Delivery Note (Addendum)
LABOR COURSE Patient came for IOL for postdates. She was given cytotec and progressed to complete dilation with SROM and then delivery of baby.  Delivery Note Called to room and patient was complete and pushing. Head delivered LOA. No nuchal cord present. Shoulder and body delivered in usual fashion. At  1905 a viable female was delivered via Vaginal, Spontaneous (Presentation:LOA).  Infant with spontaneous cry, placed on mother's abdomen, dried and stimulated. Cord clamped x 2 after 1-minute delay, and cut by FOB. Cord blood drawn. Placenta delivered spontaneously with gentle cord traction. Appears intact. Fundus firm with massage and Pitocin. Labia, perineum, vagina, and cervix inspected with 1st degree lac.    APGAR: 9, 9.   Cord: 3VC with the following complications: None.    Anesthesia: Epidural Episiotomy: None Lacerations: 1st degree, hemostatic not repaired Suture Repair:  N/A Est. Blood Loss (mL): 150  Mom to postpartum.  Baby to Couplet care / Skin to Skin.  Bess Kinds, MD 11/01/21 7:28 PM     Attestation of CNM Supervision of Resident: Evaluation and management procedures were performed by the [facility] Resident under my supervision. I was immediately present, gloved, and available for direct supervision, assistance and direction throughout this encounter.  I also confirm that I have verified the information documented in the resident's note, and that I have also personally reperformed the pertinent components of the physical exam and all of the medical decision making activities.  I have also made any necessary editorial changes.  Brand Males, CNM 11/01/2021 7:39 PM

## 2021-06-20 ENCOUNTER — Other Ambulatory Visit: Payer: Self-pay

## 2021-06-20 ENCOUNTER — Ambulatory Visit (INDEPENDENT_AMBULATORY_CARE_PROVIDER_SITE_OTHER): Payer: Medicaid Other | Admitting: Advanced Practice Midwife

## 2021-06-20 ENCOUNTER — Other Ambulatory Visit (HOSPITAL_COMMUNITY)
Admission: RE | Admit: 2021-06-20 | Discharge: 2021-06-20 | Disposition: A | Discharge status: Home / Self Care | Payer: Medicaid Other | Source: Ambulatory Visit | Attending: Advanced Practice Midwife | Admitting: Advanced Practice Midwife

## 2021-06-20 ENCOUNTER — Encounter: Payer: Self-pay | Admitting: Advanced Practice Midwife

## 2021-06-20 VITALS — BP 122/63 | HR 85 | Wt 173.6 lb

## 2021-06-20 DIAGNOSIS — Z3480 Encounter for supervision of other normal pregnancy, unspecified trimester: Secondary | ICD-10-CM | POA: Diagnosis not present

## 2021-06-20 DIAGNOSIS — N898 Other specified noninflammatory disorders of vagina: Secondary | ICD-10-CM | POA: Insufficient documentation

## 2021-06-20 DIAGNOSIS — R109 Unspecified abdominal pain: Secondary | ICD-10-CM

## 2021-06-20 DIAGNOSIS — O26892 Other specified pregnancy related conditions, second trimester: Secondary | ICD-10-CM

## 2021-06-20 DIAGNOSIS — O09523 Supervision of elderly multigravida, third trimester: Secondary | ICD-10-CM

## 2021-06-20 DIAGNOSIS — Z3A19 19 weeks gestation of pregnancy: Secondary | ICD-10-CM

## 2021-06-20 MED ORDER — ASPIRIN 81 MG PO CHEW: 81.0000 mg | CHEWABLE_TABLET | Freq: Every day | ORAL | 5 refills | Status: DC

## 2021-06-20 NOTE — Addendum Note (Signed)
Addended by: Fatima Blank A on: 06/20/2021 04:25 PM ? ? Modules accepted: Orders ? ?

## 2021-06-20 NOTE — Addendum Note (Signed)
Addended by: Jearld Adjutant on: 06/20/2021 04:35 PM ? ? Modules accepted: Orders ? ?
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
## 2021-06-21 LAB — CERVICOVAGINAL ANCILLARY ONLY
Bacterial Vaginitis (gardnerella): NEGATIVE
Candida Glabrata: NEGATIVE
Candida Vaginitis: NEGATIVE
Chlamydia: NEGATIVE
Comment: NEGATIVE
Comment: NEGATIVE
Comment: NEGATIVE
Comment: NEGATIVE
Comment: NEGATIVE
Comment: NORMAL
Neisseria Gonorrhea: NEGATIVE
Trichomonas: NEGATIVE

## 2021-06-22 LAB — CBC/D/PLT+RPR+RH+ABO+RUBIGG...
Antibody Screen: NEGATIVE
Basophils Absolute: 0 10*3/uL (ref 0.0–0.2)
Basos: 0 %
EOS (ABSOLUTE): 0.1 10*3/uL (ref 0.0–0.4)
Eos: 1 %
HCV Ab: NONREACTIVE
HIV Screen 4th Generation wRfx: NONREACTIVE
Hematocrit: 32.9 % — ABNORMAL LOW (ref 34.0–46.6)
Hemoglobin: 10.7 g/dL — ABNORMAL LOW (ref 11.1–15.9)
Hepatitis B Surface Ag: NEGATIVE
Immature Grans (Abs): 0 10*3/uL (ref 0.0–0.1)
Immature Granulocytes: 1 %
Lymphocytes Absolute: 1.8 10*3/uL (ref 0.7–3.1)
Lymphs: 24 %
MCH: 28.8 pg (ref 26.6–33.0)
MCHC: 32.5 g/dL (ref 31.5–35.7)
MCV: 89 fL (ref 79–97)
Monocytes Absolute: 0.5 10*3/uL (ref 0.1–0.9)
Monocytes: 6 %
Neutrophils Absolute: 5 10*3/uL (ref 1.4–7.0)
Neutrophils: 68 %
Platelets: 348 10*3/uL (ref 150–450)
RBC: 3.71 x10E6/uL — ABNORMAL LOW (ref 3.77–5.28)
RDW: 13.8 % (ref 11.7–15.4)
RPR Ser Ql: NONREACTIVE
Rh Factor: POSITIVE
Rubella Antibodies, IGG: 6.82 index (ref >0.99)
WBC: 7.3 10*3/uL (ref 3.4–10.8)

## 2021-06-22 LAB — AFP, SERUM, OPEN SPINA BIFIDA
AFP MoM: 1.06
AFP Value: 60.1 ng/mL
Gest. Age on Collection Date: 19.6 weeks
Maternal Age At EDD: 38 yr
OSBR Risk 1 IN: 10000
Test Results:: NEGATIVE
Weight: 173 [lb_av]

## 2021-06-22 LAB — HEMOGLOBIN A1C
Est. average glucose Bld gHb Est-mCnc: 105 mg/dL
Hgb A1c MFr Bld: 5.3 % (ref 4.8–5.6)

## 2021-06-22 LAB — HCV INTERPRETATION

## 2021-06-22 LAB — CYTOLOGY - PAP
Adequacy: ABSENT
Comment: NEGATIVE
Diagnosis: UNDETERMINED — AB
High risk HPV: NEGATIVE

## 2021-06-22 LAB — CULTURE, OB URINE

## 2021-06-22 LAB — URINE CULTURE, OB REFLEX: Organism ID, Bacteria: NO GROWTH

## 2021-06-28 ENCOUNTER — Encounter: Payer: Self-pay | Admitting: Advanced Practice Midwife

## 2021-07-04 ENCOUNTER — Emergency Department (HOSPITAL_COMMUNITY): Payer: Medicaid Other

## 2021-07-04 ENCOUNTER — Other Ambulatory Visit: Payer: Self-pay

## 2021-07-04 ENCOUNTER — Encounter (HOSPITAL_COMMUNITY): Payer: Self-pay

## 2021-07-04 ENCOUNTER — Emergency Department (HOSPITAL_COMMUNITY)
Admission: EM | Admit: 2021-07-04 | Discharge: 2021-07-05 | Disposition: A | Discharge status: Home / Self Care | Payer: Medicaid Other | Attending: Emergency Medicine | Admitting: Emergency Medicine

## 2021-07-04 DIAGNOSIS — J3489 Other specified disorders of nose and nasal sinuses: Secondary | ICD-10-CM | POA: Insufficient documentation

## 2021-07-04 DIAGNOSIS — J029 Acute pharyngitis, unspecified: Secondary | ICD-10-CM | POA: Diagnosis not present

## 2021-07-04 DIAGNOSIS — Z7982 Long term (current) use of aspirin: Secondary | ICD-10-CM | POA: Insufficient documentation

## 2021-07-04 DIAGNOSIS — R0981 Nasal congestion: Secondary | ICD-10-CM | POA: Insufficient documentation

## 2021-07-04 DIAGNOSIS — J45909 Unspecified asthma, uncomplicated: Secondary | ICD-10-CM | POA: Diagnosis not present

## 2021-07-04 DIAGNOSIS — R0602 Shortness of breath: Secondary | ICD-10-CM | POA: Insufficient documentation

## 2021-07-04 NOTE — ED Triage Notes (Signed)
Complaining of shortness of breath, cough, started 4 days ago, is hard for her to lay down, is 5 months pregnant. ?

## 2021-07-05 NOTE — ED Provider Notes (Signed)
?[facility] ?Provider Note ? ?CSN: [id number] ?Arrival date & time: 07/04/21 2220 ? ?Chief Complaint(s) ?Shortness of Breath ? ?HPI ?Sheila [name] is a 36 y.o. female G4, P3 at approximately 21 weeks 5 days here for several days of nasal congestion with postnasal drip and mild sore throat.  Worse with lying supine.  She denies any documented fevers but reports having chills.  Endorses productive cough of clear sputum.  Reports that the shortness of breath is related to air intake through her nose.  No chest congestion, tightness, or pain.  No lower extremity edema.  No known sick contacts.  No nausea or vomiting.  No other physical complaints. ? ?Patient has not taken any over-the-counter medication for symptom relief. ? ?The history is provided by the patient.  ? ?Past Medical History ?Past Medical History:  ?Diagnosis Date  ? Asthma   ? used inhaler 3 months ago  ? Atypical squamous cell changes of undetermined significance (ASCUS) on cervical cytology with negative high risk human papilloma virus (HPV) test result 07/27/2016  ? 07/2016 - repeat in 3 years  ? ?Patient Active Problem List  ? Diagnosis Date Noted  ? Supervision of other normal pregnancy, antepartum 03/14/2021  ? Group B streptococcal infection in child of prior pregnancy, currently pregnant in third trimester 05/21/2019  ? Cystic fibrosis carrier 03/25/2019  ? AMA (advanced maternal age) multigravida 35+ 02/25/2019  ? Asthma   ? ?Home Medication(s) ?Prior to Admission medications   ?Medication Sig Start Date End Date Taking? Authorizing Provider  ?acetaminophen (TYLENOL) 500 MG tablet Take 500 mg by mouth every 6 (six) hours as needed.    [provider]  ?albuterol (VENTOLIN HFA) 108 (90 Base) MCG/ACT inhaler Inhale 2 puffs into the lungs every 6 (six) hours as needed for wheezing or shortness of breath. 03/30/19   Levie HeritageStinson, Jacob J, DO  ?aspirin (ASPIRIN CHILDRENS) 81 MG chewable tablet Chew 1  tablet (81 mg total) by mouth daily. 06/20/21   Leftwich-Kirby, Wilmer FloorLisa A, CNM  ?Prenat-Fe Poly-Methfol-FA-DHA (VITAFOL ULTRA) 29-0.6-0.4-200 MG CAPS Take 1 capsule by mouth daily in the afternoon. 03/14/21   Constant, Peggy, MD  ?promethazine (PHENERGAN) 25 MG tablet Take 1 tablet (25 mg total) by mouth every 6 (six) hours as needed for nausea or vomiting. ?Patient not taking: Reported on 06/20/2021 03/14/21   Constant, Gigi GinPeggy, MD  ?                                                                                                                                  ?Allergies ?Patient has no known allergies. ? ?Review of Systems ?Review of Systems ?As noted in HPI ? ?Physical Exam ?Vital Signs  ?I have reviewed the triage vital signs ?BP 125/90 (BP Location: Right Arm)   Pulse 85   Temp 98.2 ?F (36.8 ?C) (Oral)   Resp 18   Ht 5\' 6"  (1.676 m)  Wt 78 kg   LMP 02/03/2021   SpO2 99%   BMI 27.76 kg/m?  ? ?Physical Exam ?Vitals reviewed.  ?Constitutional:   ?   General: She is not in acute distress. ?   Appearance: She is well-developed. She is not diaphoretic.  ?HENT:  ?   Head: Normocephalic and atraumatic.  ?   Right Ear: Tympanic membrane normal.  ?   Left Ear: Tympanic membrane normal.  ?   Nose: Mucosal edema, congestion and rhinorrhea present. Rhinorrhea is clear.  ?   Right Turbinates: Swollen.  ?   Right Sinus: No maxillary sinus tenderness or frontal sinus tenderness.  ?   Left Sinus: No maxillary sinus tenderness or frontal sinus tenderness.  ?   Mouth/Throat:  ?   Mouth: No oral lesions or angioedema.  ?   Tongue: No lesions.  ?   Palate: No lesions.  ?   Pharynx: No pharyngeal swelling or posterior oropharyngeal erythema.  ?   Tonsils: No tonsillar exudate.  ?   Comments: Post nasal drip  ?Eyes:  ?   General: No scleral icterus.    ?   Right eye: No discharge.     ?   Left eye: No discharge.  ?   Conjunctiva/sclera: Conjunctivae normal.  ?   Pupils: Pupils are equal, round, and reactive to light.   ?Cardiovascular:  ?   Rate and Rhythm: Normal rate and regular rhythm.  ?   Heart sounds: No murmur heard. ?  No friction rub. No gallop.  ?Pulmonary:  ?   Effort: Pulmonary effort is normal. No respiratory distress.  ?   Breath sounds: Normal breath sounds. No stridor. No rales.  ?Abdominal:  ?   General: There is no distension.  ?   Palpations: Abdomen is soft.  ?   Tenderness: There is no abdominal tenderness.  ?Musculoskeletal:     ?   General: No tenderness.  ?   Cervical back: Normal range of motion and neck supple.  ?Skin: ?   General: Skin is warm and dry.  ?   Findings: No erythema or rash.  ?Neurological:  ?   Mental Status: She is alert and oriented to person, place, and time.  ? ? ?ED Results and Treatments ?Labs ?(all labs ordered are listed, but only abnormal results are displayed) ?Labs Reviewed - No data to display                                                                                                                       ?EKG ? EKG Interpretation ? ?Date/Time:    ?Ventricular Rate:    ?PR Interval:    ?QRS Duration:   ?QT Interval:    ?QTC Calculation:   ?R Axis:     ?Text Interpretation:   ?  ? ?  ? ?Radiology ?DG Chest 2 View ? ?Result Date: 07/04/2021 ?CLINICAL DATA:  Shortness of breath EXAM: CHEST - 2 VIEW COMPARISON:  None. FINDINGS: The heart  size and mediastinal contours are within normal limits. Both lungs are clear. The visualized skeletal structures are unremarkable. IMPRESSION: No active cardiopulmonary disease. Electronically Signed   By: Charlett Nose M.D.   On: 07/04/2021 23:04   ? ?Pertinent labs & imaging results that were available during my care of the patient were reviewed by me and considered in my medical decision making (see MDM for details). ? ?Medications Ordered in ED ?Medications - No data to display                                                               ?                                                                     ?Procedures ?Procedures ? ?(including critical care time) ? ?Medical Decision Making / ED Course ? ? ? Complexity of Problem: ? ?Co-morbidities/SDOH that complicate the patient evaluation/care: ?Third trimester pregnancy ? ?Additional history obtained: ?Clinic note from [facility], documenting her gestational age as well as complications from prior pregnancies including fetal GBS sepsis ? ?Patient's presenting problem/concern and DDX listed below: ?Shortness of breath ?Most consistent with nasal congestion.  Likely seasonal allergies given her history of asthma.  Possible early viral infection.  ?No evidence of pharyngitis on exam. ? ? ?  Complexity of Data: ?  ?Cardiac Monitoring: ?None ? ?Laboratory Tests ordered listed below with my independent interpretation: ?None ?  ?Imaging Studies ordered listed below with my independent interpretation: ?Chest x-ray negative for pneumonia or pneumothorax. ?  ?  ?ED Course:   ? ?Hospitalization Considered:  ?No ? ?Assessment, Intervention, and Reassessment: ?Shortness of breath ?Related to nasal congestion with postnasal drip ?Most likely allergic.  Possible early viral, but feel less likely. ?Lungs clear to auscultation bilaterally and not consistent with asthma exacerbation.  No pneumonia noted on chest x-ray. ?Given pregnancy, management is limited. ?Patient was given supportive regimen at home. ?Informed that she can use Rhinocort as well as menthol cough drops. ?Recommended close PCP/OB follow-up  ? ? ?Final Clinical Impression(s) / ED Diagnoses ?Final diagnoses:  ?Nasal congestion  ?Post-nasal discharge  ? ?The patient appears reasonably screened and/or stabilized for discharge and I doubt any other medical condition or other [facility] requiring further screening, evaluation, or treatment in the ED at this time prior to discharge. Safe for discharge with strict return precautions. ? ?Disposition: Discharge ? ?Condition: Good ? ?I have discussed the results, Dx and Tx plan with the  patient/family who expressed understanding and agree(s) with the plan. Discharge instructions discussed at length. The patient/family was given strict return precautions who verbalized understanding of the instructions. No further questions at time of discharg

## 2021-07-05 NOTE — Discharge Instructions (Addendum)
Some tips for managing congestion during pregnancy include:3 ?Drinking plenty of water ?Using a cool-mist humidifier by your bed when you sleep ?Participating in light exercise (but you should not engage in new types of exercise while pregnant without prior approval from your healthcare provider) ?Keeping the head of your bed elevated with an extra pillow or a wedge ?Using a saline nasal spray to keep secretions thin ?Avoiding known allergy triggers, polluted air, chemicals, or cigarette smoke ? ?Inhaled nasal corticosteroids are often used to control nasal congestion during pregnancy. They may be used in some cases to control pregnancy-induced nasal symptoms. ? ?The first choice is Rhinocort (budesonide), as studies show it is generally safe to use during pregnancy.4 However, if budesonide is not effective other nasal corticosteroids may be used. ? ?You may also use menthol cough drops for your sore throat. ?

## 2021-07-10 ENCOUNTER — Ambulatory Visit: Payer: Medicaid Other | Admitting: *Deleted

## 2021-07-10 ENCOUNTER — Other Ambulatory Visit: Payer: Self-pay

## 2021-07-10 ENCOUNTER — Ambulatory Visit: Payer: Medicaid Other | Attending: Advanced Practice Midwife

## 2021-07-10 VITALS — BP 121/67 | HR 85

## 2021-07-10 DIAGNOSIS — O321XX0 Maternal care for breech presentation, not applicable or unspecified: Secondary | ICD-10-CM | POA: Insufficient documentation

## 2021-07-10 DIAGNOSIS — J45909 Unspecified asthma, uncomplicated: Secondary | ICD-10-CM | POA: Diagnosis not present

## 2021-07-10 DIAGNOSIS — O99512 Diseases of the respiratory system complicating pregnancy, second trimester: Secondary | ICD-10-CM | POA: Insufficient documentation

## 2021-07-10 DIAGNOSIS — Z3A19 19 weeks gestation of pregnancy: Secondary | ICD-10-CM

## 2021-07-10 DIAGNOSIS — Z363 Encounter for antenatal screening for malformations: Secondary | ICD-10-CM | POA: Insufficient documentation

## 2021-07-10 DIAGNOSIS — Z148 Genetic carrier of other disease: Secondary | ICD-10-CM | POA: Insufficient documentation

## 2021-07-10 DIAGNOSIS — Z3A24 24 weeks gestation of pregnancy: Secondary | ICD-10-CM | POA: Diagnosis not present

## 2021-07-10 DIAGNOSIS — Z3480 Encounter for supervision of other normal pregnancy, unspecified trimester: Secondary | ICD-10-CM | POA: Insufficient documentation

## 2021-07-10 DIAGNOSIS — O09522 Supervision of elderly multigravida, second trimester: Secondary | ICD-10-CM | POA: Diagnosis not present

## 2021-07-10 DIAGNOSIS — O09523 Supervision of elderly multigravida, third trimester: Secondary | ICD-10-CM | POA: Diagnosis not present

## 2021-07-11 ENCOUNTER — Other Ambulatory Visit: Payer: Self-pay | Admitting: *Deleted

## 2021-07-11 DIAGNOSIS — O09522 Supervision of elderly multigravida, second trimester: Secondary | ICD-10-CM

## 2021-07-11 DIAGNOSIS — Z3492 Encounter for supervision of normal pregnancy, unspecified, second trimester: Secondary | ICD-10-CM

## 2021-07-18 ENCOUNTER — Encounter: Payer: Self-pay | Admitting: Family Medicine

## 2021-07-18 ENCOUNTER — Ambulatory Visit (INDEPENDENT_AMBULATORY_CARE_PROVIDER_SITE_OTHER): Payer: Medicaid Other | Admitting: Family Medicine

## 2021-07-18 VITALS — BP 113/73 | HR 82 | Wt 176.8 lb

## 2021-07-18 DIAGNOSIS — O26899 Other specified pregnancy related conditions, unspecified trimester: Secondary | ICD-10-CM

## 2021-07-18 DIAGNOSIS — R109 Unspecified abdominal pain: Secondary | ICD-10-CM

## 2021-07-18 DIAGNOSIS — Z3A26 26 weeks gestation of pregnancy: Secondary | ICD-10-CM

## 2021-07-18 DIAGNOSIS — Z3480 Encounter for supervision of other normal pregnancy, unspecified trimester: Secondary | ICD-10-CM

## 2021-07-18 MED ORDER — VITAFOL-NANO 18-0.6-0.4 MG PO TABS: 1.0000 | ORAL_TABLET | Freq: Every day | ORAL | 11 refills | Status: DC

## 2021-07-18 NOTE — Progress Notes (Signed)
ROB [redacted] wks GA ?Reports continued pain right flank/ abdomen and low abdomen. Has been going on 2 months. Denies urinary symptoms. ? ?Continues to encounter problems on the pharmacy end with getting PNV. RX Vitafol nano/ covered by Medicaid without prior auth. ?

## 2021-07-18 NOTE — Progress Notes (Signed)
? ? ?  Subjective:  ?Sheila [name] is a 35 y.o. [phone] at [redacted]w[redacted]d being seen today for ongoing prenatal care.  She is currently monitored for the following issues for this low-risk pregnancy and has AMA (advanced maternal age) multigravida 35+; Asthma; Cystic fibrosis carrier; Group B streptococcal infection in child of prior pregnancy, currently pregnant in third trimester; and Supervision of other normal pregnancy, antepartum on their problem list. ? ?Patient reports  intermittent lower abdomen cramping and occasional sharp right lower abdomen pain . Both seem to come and go since onset of pregnancy. Denies dysuria, vaginal discharge/itching/irritation.  Contractions: Irritability. Vag. Bleeding: None.  Movement: Present. Denies leaking of fluid. ? ?The following portions of the patient's history were reviewed and updated as appropriate: allergies, current medications, past family history, past medical history, past social history, past surgical history and problem list.  ? ?Objective:  ? ?Vitals:  ? 07/18/21 1536  ?BP: 113/73  ?Pulse: 82  ?Weight: 176 lb 12.8 oz (80.2 kg)  ? ? ?Fetal Status: Fetal Heart Rate (bpm): 156 Fundal Height: 26 cm Movement: Present    ? ?General:  Alert, oriented and cooperative. Patient is in no acute distress.  ?Skin: Skin is warm and dry. No rash noted.   ?Cardiovascular: Normal heart rate noted  ?Respiratory: Normal respiratory effort, no problems with respiration noted  ?Abdomen: Soft, gravid, appropriate for gestational age. Pain/Pressure: Present     ?Pelvic:  Cervical exam deferred        ?Extremities: Normal range of motion.  Edema: None  ?Mental Status: Normal mood and affect. Normal behavior. Normal judgment and thought content.  ? ? ? ?Assessment and Plan:  ?Pregnancy: [id number] at [redacted]w[redacted]d ? ?1. Supervision of other normal pregnancy, antepartum ?Doing well.  ?- Prenatal-Fe Fum-Methf-FA w/o A (VITAFOL-NANO) 18-0.6-0.4 MG TABS; Take 1 tablet by mouth daily.  Dispense: 30  tablet; Refill: 11 ? ?2. [redacted] weeks gestation of pregnancy ?Discussed expectation for 2 hr GTT/labs next visit.  ? ?3. Abdominal cramping affecting pregnancy ?Discussed round ligament pain and expanding uterus size. Recommended tylenol, adequate hydration, frequent meals, and belly band/tape. Encouraged stretching when needed.   ? ?Preterm labor symptoms and general obstetric precautions including but not limited to vaginal bleeding, contractions, leaking of fluid and fetal movement were reviewed in detail with the patient. ?Please refer to After Visit Summary for other counseling recommendations.  ? ?Return in about 2 weeks (around 08/01/2021) for LROB with GTT. ? ? ?Allayne Stack, DO ?

## 2021-08-13 ENCOUNTER — Ambulatory Visit: Payer: Medicaid Other

## 2021-08-15 ENCOUNTER — Ambulatory Visit: Payer: Medicaid Other | Attending: Maternal & Fetal Medicine

## 2021-08-15 ENCOUNTER — Ambulatory Visit: Payer: Medicaid Other | Admitting: *Deleted

## 2021-08-15 VITALS — BP 117/68 | HR 88

## 2021-08-15 DIAGNOSIS — O09522 Supervision of elderly multigravida, second trimester: Secondary | ICD-10-CM | POA: Diagnosis not present

## 2021-08-15 DIAGNOSIS — Z148 Genetic carrier of other disease: Secondary | ICD-10-CM | POA: Diagnosis not present

## 2021-08-15 DIAGNOSIS — Z3492 Encounter for supervision of normal pregnancy, unspecified, second trimester: Secondary | ICD-10-CM | POA: Insufficient documentation

## 2021-08-15 DIAGNOSIS — Z141 Cystic fibrosis carrier: Secondary | ICD-10-CM

## 2021-08-15 DIAGNOSIS — O99513 Diseases of the respiratory system complicating pregnancy, third trimester: Secondary | ICD-10-CM

## 2021-08-15 DIAGNOSIS — O285 Abnormal chromosomal and genetic finding on antenatal screening of mother: Secondary | ICD-10-CM | POA: Diagnosis not present

## 2021-08-15 DIAGNOSIS — Z3A30 30 weeks gestation of pregnancy: Secondary | ICD-10-CM

## 2021-08-15 DIAGNOSIS — J45909 Unspecified asthma, uncomplicated: Secondary | ICD-10-CM

## 2021-08-15 DIAGNOSIS — Z3480 Encounter for supervision of other normal pregnancy, unspecified trimester: Secondary | ICD-10-CM

## 2021-08-15 DIAGNOSIS — O09523 Supervision of elderly multigravida, third trimester: Secondary | ICD-10-CM | POA: Diagnosis present

## 2021-08-16 ENCOUNTER — Other Ambulatory Visit: Payer: Medicaid Other

## 2021-08-16 ENCOUNTER — Encounter: Payer: Self-pay | Admitting: Obstetrics and Gynecology

## 2021-08-16 ENCOUNTER — Ambulatory Visit (INDEPENDENT_AMBULATORY_CARE_PROVIDER_SITE_OTHER): Payer: Medicaid Other | Admitting: Obstetrics and Gynecology

## 2021-08-16 VITALS — BP 104/69 | HR 81 | Wt 177.0 lb

## 2021-08-16 DIAGNOSIS — Z3480 Encounter for supervision of other normal pregnancy, unspecified trimester: Secondary | ICD-10-CM

## 2021-08-16 DIAGNOSIS — O09293 Supervision of pregnancy with other poor reproductive or obstetric history, third trimester: Secondary | ICD-10-CM

## 2021-08-16 DIAGNOSIS — O09523 Supervision of elderly multigravida, third trimester: Secondary | ICD-10-CM

## 2021-08-16 NOTE — Progress Notes (Signed)
? ?  PRENATAL VISIT NOTE ? ?Subjective:  ?Sheila [name] is a 36 y.o. [phone] at [redacted]w[redacted]d being seen today for ongoing prenatal care.  She is currently monitored for the following issues for this high-risk pregnancy and has AMA (advanced maternal age) multigravida 74+; Asthma; Cystic fibrosis carrier; Group B streptococcal infection in child of prior pregnancy, currently pregnant in third trimester; and Supervision of other normal pregnancy, antepartum on their problem list. ? ?Patient reports no complaints.  Contractions: Irritability. Vag. Bleeding: None.  Movement: Present. Denies leaking of fluid.  ? ?The following portions of the patient's history were reviewed and updated as appropriate: allergies, current medications, past family history, past medical history, past social history, past surgical history and problem list.  ? ?Objective:  ? ?Vitals:  ? 08/16/21 0902  ?BP: 104/69  ?Pulse: 81  ?Weight: 177 lb (80.3 kg)  ? ? ?Fetal Status: Fetal Heart Rate (bpm): 137 Fundal Height: 30 cm Movement: Present    ? ?General:  Alert, oriented and cooperative. Patient is in no acute distress.  ?Skin: Skin is warm and dry. No rash noted.   ?Cardiovascular: Normal heart rate noted  ?Respiratory: Normal respiratory effort, no problems with respiration noted  ?Abdomen: Soft, gravid, appropriate for gestational age.  Pain/Pressure: Present     ?Pelvic: Cervical exam deferred        ?Extremities: Normal range of motion.  Edema: None  ?Mental Status: Normal mood and affect. Normal behavior. Normal judgment and thought content.  ? ?Assessment and Plan:  ?Pregnancy: [id number] at [redacted]w[redacted]d ?1. Supervision of other normal pregnancy, antepartum ?Patient is doing well without complaints ?Third trimester labs and glucola today ?Plans pills for contraception ? ?2. Multigravida of advanced maternal age in third trimester ?Normal growth on 4/26 ultrasound ?Continue ASA ? ?3. Group B streptococcal infection in child of prior pregnancy,  currently pregnant in third trimester ?Prophylaxis in labor ? ?Preterm labor symptoms and general obstetric precautions including but not limited to vaginal bleeding, contractions, leaking of fluid and fetal movement were reviewed in detail with the patient. ?Please refer to After Visit Summary for other counseling recommendations.  ? ?Return in about 2 weeks (around 08/30/2021) for in person, ROB, Low risk. ? ?Future Appointments  ?Date Time Provider [place]  ?08/16/2021  9:35 AM Yana Schorr, Vickii Chafe, MD [facility] None  ? ? ?Mora Bellman, MD ? ?

## 2021-08-16 NOTE — Progress Notes (Signed)
ROB 30.1 wks ?GTT, CBC, RPR, HIV today ?TDAP offered/ considering ?Depression and anxiety screen negative ?

## 2021-08-17 LAB — CBC
Hematocrit: 31.5 % — ABNORMAL LOW (ref 34.0–46.6)
Hemoglobin: 10.6 g/dL — ABNORMAL LOW (ref 11.1–15.9)
MCH: 28.7 pg (ref 26.6–33.0)
MCHC: 33.7 g/dL (ref 31.5–35.7)
MCV: 85 fL (ref 79–97)
Platelets: 360 10*3/uL (ref 150–450)
RBC: 3.69 x10E6/uL — ABNORMAL LOW (ref 3.77–5.28)
RDW: 13.6 % (ref 11.7–15.4)
WBC: 7.1 10*3/uL (ref 3.4–10.8)

## 2021-08-17 LAB — GLUCOSE TOLERANCE, 2 HOURS W/ 1HR
Glucose, 1 hour: 163 mg/dL (ref 70–179)
Glucose, 2 hour: 132 mg/dL (ref 70–152)
Glucose, Fasting: 75 mg/dL (ref 70–91)

## 2021-08-17 LAB — RPR: RPR Ser Ql: NONREACTIVE

## 2021-08-17 LAB — HIV ANTIBODY (ROUTINE TESTING W REFLEX): HIV Screen 4th Generation wRfx: NONREACTIVE

## 2021-09-03 ENCOUNTER — Ambulatory Visit (INDEPENDENT_AMBULATORY_CARE_PROVIDER_SITE_OTHER): Payer: Medicaid Other | Admitting: Advanced Practice Midwife

## 2021-09-03 ENCOUNTER — Encounter: Payer: Self-pay | Admitting: Advanced Practice Midwife

## 2021-09-03 VITALS — BP 121/78 | HR 81 | Wt 181.3 lb

## 2021-09-03 DIAGNOSIS — Z3483 Encounter for supervision of other normal pregnancy, third trimester: Secondary | ICD-10-CM

## 2021-09-03 DIAGNOSIS — Z3480 Encounter for supervision of other normal pregnancy, unspecified trimester: Secondary | ICD-10-CM

## 2021-09-03 DIAGNOSIS — Z3A32 32 weeks gestation of pregnancy: Secondary | ICD-10-CM

## 2021-09-03 NOTE — Progress Notes (Signed)
? ?  PRENATAL VISIT NOTE ? ?Subjective:  ?Sheila [name] is a 36 y.o. [phone] at [redacted]w[redacted]d being seen today for ongoing prenatal care.  She is currently monitored for the following issues for this low-risk pregnancy and has AMA (advanced maternal age) multigravida 37+; Asthma; Cystic fibrosis carrier; Group B streptococcal infection in child of prior pregnancy, currently pregnant in third trimester; and Supervision of other normal pregnancy, antepartum on their problem list. ? ?Patient reports no complaints.  Contractions: Not present. Vag. Bleeding: None.  Movement: Present. Denies leaking of fluid.  ? ?The following portions of the patient's history were reviewed and updated as appropriate: allergies, current medications, past family history, past medical history, past social history, past surgical history and problem list.  ? ?Objective:  ? ?Vitals:  ? 09/03/21 1629  ?BP: 121/78  ?Pulse: 81  ?Weight: 181 lb 4.8 oz (82.2 kg)  ? ? ?Fetal Status: Fetal Heart Rate (bpm): 129 Fundal Height: 32 cm Movement: Present    ? ?General:  Alert, oriented and cooperative. Patient is in no acute distress.  ?Skin: Skin is warm and dry. No rash noted.   ?Cardiovascular: Normal heart rate noted  ?Respiratory: Normal respiratory effort, no problems with respiration noted  ?Abdomen: Soft, gravid, appropriate for gestational age.  Pain/Pressure: Absent     ?Pelvic: Cervical exam deferred        ?Extremities: Normal range of motion.  Edema: None  ?Mental Status: Normal mood and affect. Normal behavior. Normal judgment and thought content.  ? ?Assessment and Plan:  ?Pregnancy: [id number] at [redacted]w[redacted]d ?1. Supervision of other normal pregnancy, antepartum ?--Anticipatory guidance about next visits/weeks of pregnancy given.  ? ?2. [redacted] weeks gestation of pregnancy ? ? ?Preterm labor symptoms and general obstetric precautions including but not limited to vaginal bleeding, contractions, leaking of fluid and fetal movement were reviewed in  detail with the patient. ?Please refer to After Visit Summary for other counseling recommendations.  ? ?Return in about 2 weeks (around 09/17/2021) for Female provider preferred, LOB. ? ?No future appointments. ? ?Fatima Blank, CNM  ?

## 2021-09-03 NOTE — Progress Notes (Signed)
Pt in office for ROB visit. She does not have any concerns today.  ?

## 2021-09-19 ENCOUNTER — Ambulatory Visit (INDEPENDENT_AMBULATORY_CARE_PROVIDER_SITE_OTHER): Payer: Medicaid Other | Admitting: Women's Health

## 2021-09-19 VITALS — BP 115/75 | HR 88 | Wt 183.4 lb

## 2021-09-19 DIAGNOSIS — Z3480 Encounter for supervision of other normal pregnancy, unspecified trimester: Secondary | ICD-10-CM

## 2021-09-19 DIAGNOSIS — Z3A35 35 weeks gestation of pregnancy: Secondary | ICD-10-CM

## 2021-09-19 DIAGNOSIS — O09523 Supervision of elderly multigravida, third trimester: Secondary | ICD-10-CM

## 2021-09-19 NOTE — Patient Instructions (Signed)
Maternity Assessment Unit (MAU)  The Maternity Assessment Unit (MAU) is located at the [facility] at [facility]. The address is: [address], [address], [place], [place] [address]. Please see map below for additional directions.    The Maternity Assessment Unit is designed to help you during your pregnancy, and for up to 6 weeks after delivery, with any pregnancy- or postpartum-related emergencies, if you think you are in labor, or if your water has broken. For example, if you experience nausea and vomiting, vaginal bleeding, severe abdominal or pelvic pain, elevated blood pressure or other problems related to your pregnancy or postpartum time, please come to the Maternity Assessment Unit for assistance.        

## 2021-09-19 NOTE — Progress Notes (Signed)
Subjective:  Sheila [name] is a 36 y.o. G4P3003 at 107w0d being seen today for ongoing prenatal care.  She is currently monitored for the following issues for this low-risk pregnancy and has AMA (advanced maternal age) multigravida 35+; Asthma; Cystic fibrosis carrier; Group B streptococcal infection in child of prior pregnancy, currently pregnant in third trimester; and Supervision of other normal pregnancy, antepartum on their problem list.  Patient reports no complaints.  Contractions: Not present. Vag. Bleeding: None.  Movement: Present. Denies leaking of fluid.   The following portions of the patient's history were reviewed and updated as appropriate: allergies, current medications, past family history, past medical history, past social history, past surgical history and problem list. Problem list updated.  Objective:   Vitals:   09/19/21 1545  BP: 115/75  Pulse: 88  Weight: 183 lb 6.4 oz (83.2 kg)    Fetal Status: Fetal Heart Rate (bpm): 135   Movement: Present     General:  Alert, oriented and cooperative. Patient is in no acute distress.  Skin: Skin is warm and dry. No rash noted.   Cardiovascular: Normal heart rate noted  Respiratory: Normal respiratory effort, no problems with respiration noted  Abdomen: Soft, gravid, appropriate for gestational age. Pain/Pressure: Absent     Pelvic: Vag. Bleeding: None     Cervical exam deferred        Extremities: Normal range of motion.  Edema: None  Mental Status: Normal mood and affect. Normal behavior. Normal judgment and thought content.   Urinalysis:      Assessment and Plan:  Pregnancy: G4P3003 at [redacted]w[redacted]d  1. Supervision of other normal pregnancy, antepartum -GBS/cultures next visit     08/16/2021    9:12 AM 06/20/2021   10:39 AM 03/14/2021    1:06 PM  PHQ9 SCORE ONLY  PHQ-9 Total Score 9 4 1       08/16/2021    9:12 AM 06/20/2021   10:41 AM 03/14/2021    1:07 PM 02/25/2019    2:53 PM  GAD 7 : Generalized Anxiety  Score  Nervous, Anxious, on Edge 0 0 0 0  Control/stop worrying 0 0 0 0  Worry too much - different things 0 1 0 0  Trouble relaxing 3 0 1 0  Restless 0 0 0 0  Easily annoyed or irritable 0 1 0 0  Afraid - awful might happen 0 0 0 0  Total GAD 7 Score 3 2 1  0  Anxiety Difficulty  Not difficult at all     2. Multigravida of advanced maternal age in third trimester  3. [redacted] weeks gestation of pregnancy  Preterm labor symptoms and general obstetric precautions including but not limited to vaginal bleeding, contractions, leaking of fluid and fetal movement were reviewed in detail with the patient. I discussed the assessment and treatment plan with the patient. The patient was provided an opportunity to ask questions and all were answered. The patient agreed with the plan and demonstrated an understanding of the instructions. The patient was advised to call back or seek an in-person office evaluation/go to MAU at [facility] for any urgent or concerning symptoms. Please refer to After Visit Summary for other counseling recommendations.  Return in about 1 week (around 09/26/2021) for in-person LOB/APP OK/GBS/cultures.   Aniylah Avans, [facility], NP

## 2021-09-19 NOTE — Progress Notes (Signed)
ROB 35 wks No concerns

## 2021-09-27 ENCOUNTER — Other Ambulatory Visit (HOSPITAL_COMMUNITY)
Admission: RE | Admit: 2021-09-27 | Discharge: 2021-09-27 | Disposition: A | Discharge status: Home / Self Care | Payer: Medicaid Other | Source: Ambulatory Visit | Attending: Obstetrics | Admitting: Obstetrics

## 2021-09-27 ENCOUNTER — Ambulatory Visit (INDEPENDENT_AMBULATORY_CARE_PROVIDER_SITE_OTHER): Payer: Medicaid Other | Admitting: Obstetrics

## 2021-09-27 VITALS — BP 125/78 | HR 89 | Wt 190.0 lb

## 2021-09-27 DIAGNOSIS — Z3480 Encounter for supervision of other normal pregnancy, unspecified trimester: Secondary | ICD-10-CM | POA: Insufficient documentation

## 2021-09-27 DIAGNOSIS — K5901 Slow transit constipation: Secondary | ICD-10-CM

## 2021-09-27 DIAGNOSIS — O09293 Supervision of pregnancy with other poor reproductive or obstetric history, third trimester: Secondary | ICD-10-CM

## 2021-09-27 DIAGNOSIS — O09523 Supervision of elderly multigravida, third trimester: Secondary | ICD-10-CM

## 2021-09-27 MED ORDER — DOCUSATE SODIUM 100 MG PO CAPS
100.0000 mg | ORAL_CAPSULE | Freq: Two times a day (BID) | ORAL | 11 refills | Status: DC
Start: 2021-09-27 — End: 2021-11-03

## 2021-09-27 MED ORDER — POLYETHYLENE GLYCOL 3350 17 GM/SCOOP PO POWD: ORAL | 11 refills | Status: DC

## 2021-09-27 NOTE — Progress Notes (Signed)
Pt reports fetal movement, denies pain.  

## 2021-09-27 NOTE — Progress Notes (Signed)
Subjective:  Sheila [name] is a 36 y.o. [phone] at [redacted]w[redacted]d being seen today for ongoing prenatal care.  She is currently monitored for the following issues for this low-risk pregnancy and has AMA (advanced maternal age) multigravida 76+; Asthma; Cystic fibrosis carrier; Group B streptococcal infection in child of prior pregnancy, currently pregnant in third trimester; and Supervision of other normal pregnancy, antepartum on their problem list.  Patient reports  chronic constipation .  Contractions: Not present. Vag. Bleeding: None.  Movement: Present. Denies leaking of fluid.   The following portions of the patient's history were reviewed and updated as appropriate: allergies, current medications, past family history, past medical history, past social history, past surgical history and problem list. Problem list updated.  Objective:   Vitals:   09/27/21 1432  BP: 125/78  Pulse: 89  Weight: 190 lb (86.2 kg)    Fetal Status: Fetal Heart Rate (bpm): 140   Movement: Present     General:  Alert, oriented and cooperative. Patient is in no acute distress.  Skin: Skin is warm and dry. No rash noted.   Cardiovascular: Normal heart rate noted  Respiratory: Normal respiratory effort, no problems with respiration noted  Abdomen: Soft, gravid, appropriate for gestational age. Pain/Pressure: Present     Pelvic:  Cervical exam deferred        Extremities: Normal range of motion.  Edema: None  Mental Status: Normal mood and affect. Normal behavior. Normal judgment and thought content.   Urinalysis:      Assessment and Plan:  Pregnancy: G4P3003 at [redacted]w[redacted]d  1. Supervision of other normal pregnancy, antepartum Rx: - Cervicovaginal ancillary only( [place]) - Strep Gp B NAA  2. Multigravida of advanced maternal age in third trimester  3. Group B streptococcal infection in child of prior pregnancy, currently pregnant in third trimester - treat in labor  4. Constipation by delayed  colonic transit Rx: - docusate sodium (COLACE) 100 MG capsule; Take 1 capsule (100 mg total) by mouth 2 (two) times daily.  Dispense: 60 capsule; Refill: 11 - needs GI referral after delivery for work-up of chronic constipation   Term labor symptoms and general obstetric precautions including but not limited to vaginal bleeding, contractions, leaking of fluid and fetal movement were reviewed in detail with the patient. Please refer to After Visit Summary for other counseling recommendations.   Return in about 1 week (around 10/04/2021) for [place].   Shelly Bombard, MD  09/27/21

## 2021-09-29 LAB — STREP GP B NAA: Strep Gp B NAA: NEGATIVE

## 2021-10-01 LAB — CERVICOVAGINAL ANCILLARY ONLY
Bacterial Vaginitis (gardnerella): NEGATIVE
Candida Glabrata: NEGATIVE
Candida Vaginitis: NEGATIVE
Chlamydia: NEGATIVE
Comment: NEGATIVE
Comment: NEGATIVE
Comment: NEGATIVE
Comment: NEGATIVE
Comment: NEGATIVE
Comment: NORMAL
Neisseria Gonorrhea: NEGATIVE
Trichomonas: NEGATIVE

## 2021-10-02 ENCOUNTER — Inpatient Hospital Stay (HOSPITAL_COMMUNITY)
Admission: EM | Admit: 2021-10-02 | Discharge: 2021-10-02 | Disposition: A | Discharge status: Home / Self Care | Payer: Medicaid Other | Attending: Obstetrics & Gynecology | Admitting: Obstetrics & Gynecology

## 2021-10-02 ENCOUNTER — Inpatient Hospital Stay (HOSPITAL_COMMUNITY): Payer: Medicaid Other

## 2021-10-02 ENCOUNTER — Other Ambulatory Visit: Payer: Self-pay

## 2021-10-02 ENCOUNTER — Encounter (HOSPITAL_COMMUNITY): Payer: Self-pay | Admitting: *Deleted

## 2021-10-02 DIAGNOSIS — O26893 Other specified pregnancy related conditions, third trimester: Secondary | ICD-10-CM | POA: Diagnosis not present

## 2021-10-02 DIAGNOSIS — O9A213 Injury, poisoning and certain other consequences of external causes complicating pregnancy, third trimester: Secondary | ICD-10-CM | POA: Diagnosis not present

## 2021-10-02 DIAGNOSIS — R1030 Lower abdominal pain, unspecified: Secondary | ICD-10-CM | POA: Diagnosis present

## 2021-10-02 DIAGNOSIS — Y939 Activity, unspecified: Secondary | ICD-10-CM | POA: Insufficient documentation

## 2021-10-02 DIAGNOSIS — O99513 Diseases of the respiratory system complicating pregnancy, third trimester: Secondary | ICD-10-CM | POA: Insufficient documentation

## 2021-10-02 DIAGNOSIS — Z3A36 36 weeks gestation of pregnancy: Secondary | ICD-10-CM | POA: Diagnosis not present

## 2021-10-02 LAB — CBC
HCT: 31.6 % — ABNORMAL LOW (ref 36.0–46.0)
Hemoglobin: 10.3 g/dL — ABNORMAL LOW (ref 12.0–15.0)
MCH: 29.2 pg (ref 26.0–34.0)
MCHC: 32.6 g/dL (ref 30.0–36.0)
MCV: 89.5 fL (ref 80.0–100.0)
Platelets: 254 10*3/uL (ref 150–400)
RBC: 3.53 MIL/uL — ABNORMAL LOW (ref 3.87–5.11)
RDW: 14.5 % (ref 11.5–15.5)
WBC: 6.4 10*3/uL (ref 4.0–10.5)
nRBC: 0 % (ref 0.0–0.2)

## 2021-10-02 LAB — PROTIME-INR
INR: 1.2 (ref 0.8–1.2)
Prothrombin Time: 14.6 seconds (ref 11.4–15.2)

## 2021-10-02 LAB — I-STAT CHEM 8, ED
BUN: 6 mg/dL (ref 6–20)
Calcium, Ion: 1.05 mmol/L — ABNORMAL LOW (ref 1.15–1.40)
Chloride: 109 mmol/L (ref 98–111)
Creatinine, Ser: 0.3 mg/dL — ABNORMAL LOW (ref 0.44–1.00)
Glucose, Bld: 98 mg/dL (ref 70–99)
HCT: 29 % — ABNORMAL LOW (ref 36.0–46.0)
Hemoglobin: 9.9 g/dL — ABNORMAL LOW (ref 12.0–15.0)
Potassium: 3.5 mmol/L (ref 3.5–5.1)
Sodium: 137 mmol/L (ref 135–145)
TCO2: 18 mmol/L — ABNORMAL LOW (ref 22–32)

## 2021-10-02 LAB — URINALYSIS, ROUTINE W REFLEX MICROSCOPIC
Bilirubin Urine: NEGATIVE
Glucose, UA: NEGATIVE mg/dL
Hgb urine dipstick: NEGATIVE
Ketones, ur: NEGATIVE mg/dL
Leukocytes,Ua: NEGATIVE
Nitrite: NEGATIVE
Protein, ur: NEGATIVE mg/dL
Specific Gravity, Urine: 1.005 (ref 1.005–1.030)
pH: 6 (ref 5.0–8.0)

## 2021-10-02 LAB — COMPREHENSIVE METABOLIC PANEL
ALT: 9 U/L (ref 0–44)
AST: 19 U/L (ref 15–41)
Albumin: 2.7 g/dL — ABNORMAL LOW (ref 3.5–5.0)
Alkaline Phosphatase: 97 U/L (ref 38–126)
Anion gap: 8 (ref 5–15)
BUN: 7 mg/dL (ref 6–20)
CO2: 17 mmol/L — ABNORMAL LOW (ref 22–32)
Calcium: 8.4 mg/dL — ABNORMAL LOW (ref 8.9–10.3)
Chloride: 109 mmol/L (ref 98–111)
Creatinine, Ser: 0.46 mg/dL (ref 0.44–1.00)
GFR, Estimated: >60 mL/min (ref >60)
Glucose, Bld: 97 mg/dL (ref 70–99)
Potassium: 3.4 mmol/L — ABNORMAL LOW (ref 3.5–5.1)
Sodium: 134 mmol/L — ABNORMAL LOW (ref 135–145)
Total Bilirubin: 0.3 mg/dL (ref 0.3–1.2)
Total Protein: 6.4 g/dL — ABNORMAL LOW (ref 6.5–8.1)

## 2021-10-02 LAB — ETHANOL: Alcohol, Ethyl (B): <10 mg/dL (ref <10)

## 2021-10-02 LAB — SAMPLE TO BLOOD BANK

## 2021-10-02 MED ORDER — LACTATED RINGERS IV SOLN
INTRAVENOUS | Status: AC | PRN
  Administered 2021-10-02: 999 mL via INTRAVENOUS

## 2021-10-02 MED ORDER — ACETAMINOPHEN 500 MG PO TABS
1000.0000 mg | ORAL_TABLET | Freq: Once | ORAL | Status: AC
  Administered 2021-10-02: 1000 mg via ORAL
  Filled 2021-10-02: qty 2
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
## 2021-10-02 NOTE — Progress Notes (Signed)
MAU notified of patient status.  Anticipate transfer when cleared medically.
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
## 2021-10-02 NOTE — Progress Notes (Signed)
To MAU 123 for continued monitoring.  Cleared Medically by [facility] MD.
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
## 2021-10-02 NOTE — MAU Note (Signed)
CT placing transport for patient now.
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
## 2021-10-09 ENCOUNTER — Ambulatory Visit (INDEPENDENT_AMBULATORY_CARE_PROVIDER_SITE_OTHER): Payer: Self-pay | Admitting: Advanced Practice Midwife

## 2021-10-09 ENCOUNTER — Encounter: Payer: Self-pay | Admitting: Advanced Practice Midwife

## 2021-10-09 ENCOUNTER — Encounter: Payer: Medicaid Other | Admitting: Obstetrics

## 2021-10-09 VITALS — BP 127/81 | HR 89 | Wt 188.8 lb

## 2021-10-09 DIAGNOSIS — Z3480 Encounter for supervision of other normal pregnancy, unspecified trimester: Secondary | ICD-10-CM

## 2021-10-09 DIAGNOSIS — O09293 Supervision of pregnancy with other poor reproductive or obstetric history, third trimester: Secondary | ICD-10-CM

## 2021-10-09 DIAGNOSIS — O09523 Supervision of elderly multigravida, third trimester: Secondary | ICD-10-CM

## 2021-10-09 NOTE — Progress Notes (Signed)
   PRENATAL VISIT NOTE  Subjective:  Sheila [name] is a 36 y.o. [phone] at 102w6d being seen today for ongoing prenatal care.  She is currently monitored for the following issues for this low-risk pregnancy and has AMA (advanced maternal age) multigravida 35+; Asthma; Cystic fibrosis carrier; Group B streptococcal infection in child of prior pregnancy, currently pregnant in third trimester; and Supervision of other normal pregnancy, antepartum on their problem list.  Patient reports no complaints.  Contractions: Irritability. Vag. Bleeding: None.  Movement: Present. Denies leaking of fluid.   The following portions of the patient's history were reviewed and updated as appropriate: allergies, current medications, past family history, past medical history, past social history, past surgical history and problem list.   Objective:   Vitals:   10/09/21 1341  BP: 127/81  Pulse: 89  Weight: 188 lb 12.8 oz (85.6 kg)    Fetal Status: Fetal Heart Rate (bpm): 165   Movement: Present     General:  Alert, oriented and cooperative. Patient is in no acute distress.  Skin: Skin is warm and dry. No rash noted.   Cardiovascular: Normal heart rate noted  Respiratory: Normal respiratory effort, no problems with respiration noted  Abdomen: Soft, gravid, appropriate for gestational age.  Pain/Pressure: Present     Pelvic: Cervical exam deferred        Extremities: Normal range of motion.  Edema: Trace  Mental Status: Normal mood and affect. Normal behavior. Normal judgment and thought content.   Assessment and Plan:  Pregnancy: G4P3003 at [redacted]w[redacted]d 1. Supervision of other normal pregnancy, antepartum --Anticipatory guidance about next visits/weeks of pregnancy given.   2. Multigravida of advanced maternal age in third trimester   3. Group B streptococcal infection in child of prior pregnancy, currently pregnant in third trimester --Treat in labor  Term labor symptoms and general obstetric  precautions including but not limited to vaginal bleeding, contractions, leaking of fluid and fetal movement were reviewed in detail with the patient. Please refer to After Visit Summary for other counseling recommendations.   Return in about 1 week (around 10/16/2021) for As scheduled.  Future Appointments  Date Time Provider Department Center  10/16/2021  4:10 PM Milas Hock, MD [facility] None  10/24/2021  1:35 PM Marny Lowenstein, PA-C [facility] None     Sharen Counter, CNM

## 2021-10-16 ENCOUNTER — Ambulatory Visit (INDEPENDENT_AMBULATORY_CARE_PROVIDER_SITE_OTHER): Payer: Medicaid Other | Admitting: Obstetrics and Gynecology

## 2021-10-16 ENCOUNTER — Encounter: Payer: Self-pay | Admitting: Obstetrics and Gynecology

## 2021-10-16 VITALS — BP 122/78 | HR 87 | Wt 187.5 lb

## 2021-10-16 DIAGNOSIS — Z3480 Encounter for supervision of other normal pregnancy, unspecified trimester: Secondary | ICD-10-CM

## 2021-10-16 DIAGNOSIS — Z3A38 38 weeks gestation of pregnancy: Secondary | ICD-10-CM

## 2021-10-16 DIAGNOSIS — O09293 Supervision of pregnancy with other poor reproductive or obstetric history, third trimester: Secondary | ICD-10-CM

## 2021-10-16 DIAGNOSIS — Z3483 Encounter for supervision of other normal pregnancy, third trimester: Secondary | ICD-10-CM

## 2021-10-16 DIAGNOSIS — O09523 Supervision of elderly multigravida, third trimester: Secondary | ICD-10-CM

## 2021-10-16 NOTE — Progress Notes (Signed)
   PRENATAL VISIT NOTE  Subjective:  Sheila [name] is a 36 y.o. G4P3003 at [redacted]w[redacted]d being seen today for ongoing prenatal care.  She is currently monitored for the following issues for this high-risk pregnancy and has AMA (advanced maternal age) multigravida 35+; Asthma; Cystic fibrosis carrier; Group B streptococcal infection in child of prior pregnancy, currently pregnant in third trimester; and Supervision of other normal pregnancy, antepartum on their problem list.  Patient reports no complaints.  Contractions: Irritability. Vag. Bleeding: None.  Movement: Present. Denies leaking of fluid.   The following portions of the patient's history were reviewed and updated as appropriate: allergies, current medications, past family history, past medical history, past social history, past surgical history and problem list.   Objective:   Vitals:   10/16/21 1624  BP: 122/78  Pulse: 87  Weight: 187 lb 8 oz (85 kg)    Fetal Status: Fetal Heart Rate (bpm): 151   Movement: Present     General:  Alert, oriented and cooperative. Patient is in no acute distress.  Skin: Skin is warm and dry. No rash noted.   Cardiovascular: Normal heart rate noted  Respiratory: Normal respiratory effort, no problems with respiration noted  Abdomen: Soft, gravid, appropriate for gestational age.  Pain/Pressure: Present     Pelvic: Cervical exam deferred        Extremities: Normal range of motion.  Edema: None  Mental Status: Normal mood and affect. Normal behavior. Normal judgment and thought content.   Assessment and Plan:  Pregnancy: G4P3003 at [redacted]w[redacted]d 1. Multigravida of advanced maternal age in third trimester LR NIPS  2. Group B streptococcal infection in child of prior pregnancy, currently pregnant in third trimester PCN in labor  3. Supervision of other normal pregnancy, antepartum Continue routine PNC  Term labor symptoms and general obstetric precautions including but not limited to vaginal  bleeding, contractions, leaking of fluid and fetal movement were reviewed in detail with the patient. Please refer to After Visit Summary for other counseling recommendations.   Return in about 1 week (around 10/23/2021) for OB VISIT, MD or APP.  Future Appointments  Date Time Provider Department Center  10/24/2021  1:35 PM Marny Lowenstein, PA-C [facility] None    Milas Hock, MD

## 2021-10-24 ENCOUNTER — Ambulatory Visit (INDEPENDENT_AMBULATORY_CARE_PROVIDER_SITE_OTHER): Payer: Medicaid Other | Admitting: Medical

## 2021-10-24 ENCOUNTER — Encounter: Payer: Self-pay | Admitting: Medical

## 2021-10-24 VITALS — BP 128/88 | HR 88 | Wt 189.0 lb

## 2021-10-24 DIAGNOSIS — Z3A40 40 weeks gestation of pregnancy: Secondary | ICD-10-CM

## 2021-10-24 DIAGNOSIS — O09293 Supervision of pregnancy with other poor reproductive or obstetric history, third trimester: Secondary | ICD-10-CM

## 2021-10-24 DIAGNOSIS — Z141 Cystic fibrosis carrier: Secondary | ICD-10-CM

## 2021-10-24 DIAGNOSIS — Z3480 Encounter for supervision of other normal pregnancy, unspecified trimester: Secondary | ICD-10-CM

## 2021-10-24 DIAGNOSIS — O09523 Supervision of elderly multigravida, third trimester: Secondary | ICD-10-CM

## 2021-10-24 NOTE — Addendum Note (Signed)
Addended by: Maretta Bees on: 10/24/2021 02:29 PM   Modules accepted: Orders

## 2021-10-24 NOTE — Progress Notes (Signed)
   PRENATAL VISIT NOTE  Subjective:  Sheila [name] is a 36 y.o. G4P3003 at [redacted]w[redacted]d being seen today for ongoing prenatal care.  She is currently monitored for the following issues for this high-risk pregnancy and has AMA (advanced maternal age) multigravida 35+; Asthma; Cystic fibrosis carrier; Group B streptococcal infection in child of prior pregnancy, currently pregnant in third trimester; and Supervision of other normal pregnancy, antepartum on their problem list.  Patient reports occasional contractions and pelvic pressure.  Contractions: Irritability. Vag. Bleeding: None.  Movement: Present. Denies leaking of fluid.   The following portions of the patient's history were reviewed and updated as appropriate: allergies, current medications, past family history, past medical history, past social history, past surgical history and problem list.   Objective:   Vitals:   10/24/21 1333  BP: 128/88  Pulse: 88  Weight: 189 lb (85.7 kg)    Fetal Status: Fetal Heart Rate (bpm): 135   Movement: Present  Presentation: Vertex  General:  Alert, oriented and cooperative. Patient is in no acute distress.  Skin: Skin is warm and dry. No rash noted.   Cardiovascular: Normal heart rate noted  Respiratory: Normal respiratory effort, no problems with respiration noted  Abdomen: Soft, gravid, appropriate for gestational age.  Pain/Pressure: Present     Pelvic: Cervical exam performed in the presence of a chaperone Dilation: 1.5 Effacement (%): Thick Station: -3  Extremities: Normal range of motion.  Edema: None  Mental Status: Normal mood and affect. Normal behavior. Normal judgment and thought content.    Fetal Monitoring: Baseline: 130 bpm Variability: moderate Accelerations: 15 x 15 Decelerations: none  Assessment and Plan:  Pregnancy: G4P3003 at [redacted]w[redacted]d 1. Supervision of other normal pregnancy, antepartum - IOL will be scheduled for 7/12  2. Group B streptococcal infection in  child of prior pregnancy, currently pregnant in third trimester - treat in labor, patient aware  3. Cystic fibrosis carrier  4. Multigravida of advanced maternal age in third trimester - < 55 y.o, no change  5. [redacted] weeks gestation of pregnancy  Term labor symptoms and general obstetric precautions including but not limited to vaginal bleeding, contractions, leaking of fluid and fetal movement were reviewed in detail with the patient. Please refer to After Visit Summary for other counseling recommendations.   Return in about 6 weeks (around 12/05/2021) for PP visit.  No future appointments.  Vonzella Nipple, PA-C

## 2021-10-25 ENCOUNTER — Other Ambulatory Visit: Payer: Self-pay | Admitting: Advanced Practice Midwife

## 2021-10-31 ENCOUNTER — Inpatient Hospital Stay (HOSPITAL_COMMUNITY): Payer: Medicaid Other

## 2021-10-31 ENCOUNTER — Other Ambulatory Visit: Payer: Self-pay | Admitting: Advanced Practice Midwife

## 2021-10-31 NOTE — Progress Notes (Signed)
Called patient for induction patient stated she does not have a ride today and will come tomorrow. Dr Debroah Loop made aware at [phone].

## 2021-11-01 ENCOUNTER — Other Ambulatory Visit: Payer: Self-pay

## 2021-11-01 ENCOUNTER — Inpatient Hospital Stay (HOSPITAL_COMMUNITY): Payer: Medicaid Other

## 2021-11-01 ENCOUNTER — Inpatient Hospital Stay (HOSPITAL_COMMUNITY): Payer: Medicaid Other | Admitting: Anesthesiology

## 2021-11-01 ENCOUNTER — Encounter (HOSPITAL_COMMUNITY): Payer: Self-pay | Admitting: Obstetrics & Gynecology

## 2021-11-01 ENCOUNTER — Other Ambulatory Visit: Payer: Self-pay | Admitting: Advanced Practice Midwife

## 2021-11-01 ENCOUNTER — Inpatient Hospital Stay (HOSPITAL_COMMUNITY)
Admission: AD | Admit: 2021-11-01 | Discharge: 2021-11-03 | DRG: 807 | Disposition: A | Discharge status: Home / Self Care | Payer: Medicaid Other | Attending: Family Medicine | Admitting: Family Medicine

## 2021-11-01 DIAGNOSIS — O9952 Diseases of the respiratory system complicating childbirth: Secondary | ICD-10-CM | POA: Diagnosis present

## 2021-11-01 DIAGNOSIS — O09529 Supervision of elderly multigravida, unspecified trimester: Secondary | ICD-10-CM

## 2021-11-01 DIAGNOSIS — O480 Post-term pregnancy: Secondary | ICD-10-CM | POA: Diagnosis present

## 2021-11-01 DIAGNOSIS — J45909 Unspecified asthma, uncomplicated: Secondary | ICD-10-CM | POA: Diagnosis present

## 2021-11-01 DIAGNOSIS — Z8619 Personal history of other infectious and parasitic diseases: Secondary | ICD-10-CM | POA: Diagnosis present

## 2021-11-01 DIAGNOSIS — Z3A41 41 weeks gestation of pregnancy: Secondary | ICD-10-CM

## 2021-11-01 DIAGNOSIS — Z3A40 40 weeks gestation of pregnancy: Secondary | ICD-10-CM

## 2021-11-01 DIAGNOSIS — Z141 Cystic fibrosis carrier: Secondary | ICD-10-CM

## 2021-11-01 DIAGNOSIS — Z3480 Encounter for supervision of other normal pregnancy, unspecified trimester: Secondary | ICD-10-CM

## 2021-11-01 LAB — TYPE AND SCREEN
ABO/RH(D): A POS
Antibody Screen: NEGATIVE

## 2021-11-01 LAB — CBC
HCT: 31.6 % — ABNORMAL LOW (ref 36.0–46.0)
Hemoglobin: 10.5 g/dL — ABNORMAL LOW (ref 12.0–15.0)
MCH: 28.7 pg (ref 26.0–34.0)
MCHC: 33.2 g/dL (ref 30.0–36.0)
MCV: 86.3 fL (ref 80.0–100.0)
Platelets: 287 10*3/uL (ref 150–400)
RBC: 3.66 MIL/uL — ABNORMAL LOW (ref 3.87–5.11)
RDW: 15 % (ref 11.5–15.5)
WBC: 6.2 10*3/uL (ref 4.0–10.5)
nRBC: 0.3 % — ABNORMAL HIGH (ref 0.0–0.2)

## 2021-11-01 LAB — RPR: RPR Ser Ql: NONREACTIVE

## 2021-11-01 MED ORDER — DIPHENHYDRAMINE HCL 50 MG/ML IJ SOLN: 12.5000 mg | INTRAMUSCULAR | Status: DC | PRN

## 2021-11-01 MED ORDER — TERBUTALINE SULFATE 1 MG/ML IJ SOLN: 0.2500 mg | Freq: Once | INTRAMUSCULAR | Status: DC | PRN

## 2021-11-01 MED ORDER — ZOLPIDEM TARTRATE 5 MG PO TABS: 5.0000 mg | ORAL_TABLET | Freq: Every evening | ORAL | Status: DC | PRN

## 2021-11-01 MED ORDER — FENTANYL CITRATE (PF) 100 MCG/2ML IJ SOLN
100.0000 ug | INTRAMUSCULAR | Status: DC | PRN
  Administered 2021-11-01 (×2): 100 ug via INTRAVENOUS
  Filled 2021-11-01: qty 2

## 2021-11-01 MED ORDER — FENTANYL CITRATE (PF) 100 MCG/2ML IJ SOLN
INTRAMUSCULAR | Status: AC
  Filled 2021-11-01: qty 2

## 2021-11-01 MED ORDER — EPHEDRINE 5 MG/ML INJ: 10.0000 mg | INTRAVENOUS | Status: DC | PRN

## 2021-11-01 MED ORDER — SOD CITRATE-CITRIC ACID 500-334 MG/5ML PO SOLN: 30.0000 mL | ORAL | Status: DC | PRN

## 2021-11-01 MED ORDER — LACTATED RINGERS IV SOLN: INTRAVENOUS | Status: DC

## 2021-11-01 MED ORDER — COCONUT OIL OIL: 1.0000 | TOPICAL_OIL | Status: DC | PRN

## 2021-11-01 MED ORDER — PENICILLIN G POT IN DEXTROSE 60000 UNIT/ML IV SOLN
3.0000 10*6.[IU] | INTRAVENOUS | Status: DC
  Administered 2021-11-01: 3 10*6.[IU] via INTRAVENOUS
  Filled 2021-11-01: qty 50

## 2021-11-01 MED ORDER — ONDANSETRON HCL 4 MG/2ML IJ SOLN: 4.0000 mg | Freq: Four times a day (QID) | INTRAMUSCULAR | Status: DC | PRN

## 2021-11-01 MED ORDER — ACETAMINOPHEN 325 MG PO TABS: 650.0000 mg | ORAL_TABLET | ORAL | Status: DC | PRN

## 2021-11-01 MED ORDER — WITCH HAZEL-GLYCERIN EX PADS: 1.0000 | MEDICATED_PAD | CUTANEOUS | Status: DC | PRN

## 2021-11-01 MED ORDER — PHENYLEPHRINE 80 MCG/ML (10ML) SYRINGE FOR IV PUSH (FOR BLOOD PRESSURE SUPPORT): 80.0000 ug | PREFILLED_SYRINGE | INTRAVENOUS | Status: DC | PRN

## 2021-11-01 MED ORDER — ONDANSETRON HCL 4 MG/2ML IJ SOLN: 4.0000 mg | INTRAMUSCULAR | Status: DC | PRN

## 2021-11-01 MED ORDER — BENZOCAINE-MENTHOL 20-0.5 % EX AERO: 1.0000 | INHALATION_SPRAY | CUTANEOUS | Status: DC | PRN

## 2021-11-01 MED ORDER — LIDOCAINE HCL (PF) 1 % IJ SOLN: 30.0000 mL | INTRAMUSCULAR | Status: DC | PRN

## 2021-11-01 MED ORDER — GUAIFENESIN-DM 100-10 MG/5ML PO SYRP
5.0000 mL | ORAL_SOLUTION | ORAL | Status: DC | PRN
Start: 2021-11-01 — End: 2021-11-03

## 2021-11-01 MED ORDER — LIDOCAINE HCL (PF) 1 % IJ SOLN
INTRAMUSCULAR | Status: DC | PRN
  Administered 2021-11-01 (×2): 5 mL via EPIDURAL

## 2021-11-01 MED ORDER — FENTANYL-BUPIVACAINE-NACL 0.5-0.125-0.9 MG/250ML-% EP SOLN
12.0000 mL/h | EPIDURAL | Status: DC | PRN
  Administered 2021-11-01: 12 mL/h via EPIDURAL
  Filled 2021-11-01: qty 250

## 2021-11-01 MED ORDER — DIBUCAINE (PERIANAL) 1 % EX OINT: 1.0000 | TOPICAL_OINTMENT | CUTANEOUS | Status: DC | PRN

## 2021-11-01 MED ORDER — SENNOSIDES-DOCUSATE SODIUM 8.6-50 MG PO TABS
2.0000 | ORAL_TABLET | Freq: Every day | ORAL | Status: DC
  Administered 2021-11-02 – 2021-11-03 (×2): 2 via ORAL
  Filled 2021-11-01 (×2): qty 2

## 2021-11-01 MED ORDER — MISOPROSTOL 50MCG HALF TABLET
50.0000 ug | ORAL_TABLET | ORAL | Status: DC | PRN
  Administered 2021-11-01 (×2): 50 ug via ORAL
  Filled 2021-11-01 (×2): qty 1

## 2021-11-01 MED ORDER — OXYTOCIN-SODIUM CHLORIDE 30-0.9 UT/500ML-% IV SOLN
2.5000 [IU]/h | INTRAVENOUS | Status: DC
  Filled 2021-11-01: qty 500

## 2021-11-01 MED ORDER — SIMETHICONE 80 MG PO CHEW: 80.0000 mg | CHEWABLE_TABLET | ORAL | Status: DC | PRN

## 2021-11-01 MED ORDER — OXYCODONE-ACETAMINOPHEN 5-325 MG PO TABS: 1.0000 | ORAL_TABLET | ORAL | Status: DC | PRN

## 2021-11-01 MED ORDER — LACTATED RINGERS IV SOLN: 500.0000 mL | Freq: Once | INTRAVENOUS | Status: DC

## 2021-11-01 MED ORDER — LACTATED RINGERS IV SOLN: 500.0000 mL | INTRAVENOUS | Status: DC | PRN

## 2021-11-01 MED ORDER — DIPHENHYDRAMINE HCL 25 MG PO CAPS: 25.0000 mg | ORAL_CAPSULE | Freq: Four times a day (QID) | ORAL | Status: DC | PRN

## 2021-11-01 MED ORDER — TETANUS-DIPHTH-ACELL PERTUSSIS 5-2.5-18.5 LF-MCG/0.5 IM SUSY: 0.5000 mL | PREFILLED_SYRINGE | Freq: Once | INTRAMUSCULAR | Status: DC

## 2021-11-01 MED ORDER — SODIUM CHLORIDE 0.9 % IV SOLN
5.0000 10*6.[IU] | Freq: Once | INTRAVENOUS | Status: AC
  Administered 2021-11-01: 5 10*6.[IU] via INTRAVENOUS
  Filled 2021-11-01: qty 5

## 2021-11-01 MED ORDER — OXYTOCIN BOLUS FROM INFUSION
333.0000 mL | Freq: Once | INTRAVENOUS | Status: AC
  Administered 2021-11-01: 333 mL via INTRAVENOUS

## 2021-11-01 MED ORDER — OXYCODONE-ACETAMINOPHEN 5-325 MG PO TABS: 2.0000 | ORAL_TABLET | ORAL | Status: DC | PRN

## 2021-11-01 MED ORDER — IBUPROFEN 600 MG PO TABS
600.0000 mg | ORAL_TABLET | Freq: Four times a day (QID) | ORAL | Status: DC
  Administered 2021-11-01 – 2021-11-03 (×7): 600 mg via ORAL
  Filled 2021-11-01 (×7): qty 1

## 2021-11-01 MED ORDER — MISOPROSTOL 25 MCG QUARTER TABLET: 25.0000 ug | ORAL_TABLET | ORAL | Status: DC | PRN

## 2021-11-01 MED ORDER — PRENATAL MULTIVITAMIN CH
1.0000 | ORAL_TABLET | Freq: Every day | ORAL | Status: DC
  Administered 2021-11-02 – 2021-11-03 (×2): 1 via ORAL
  Filled 2021-11-01 (×2): qty 1

## 2021-11-01 MED ORDER — ONDANSETRON HCL 4 MG PO TABS: 4.0000 mg | ORAL_TABLET | ORAL | Status: DC | PRN

## 2021-11-01 MED ORDER — ACETAMINOPHEN 325 MG PO TABS
650.0000 mg | ORAL_TABLET | ORAL | Status: DC | PRN
  Administered 2021-11-02 (×2): 650 mg via ORAL
  Filled 2021-11-01 (×2): qty 2

## 2021-11-01 NOTE — Anesthesia Procedure Notes (Signed)
Epidural Patient location during procedure: OB Start time: 11/01/2021 6:22 PM End time: 11/01/2021 6:30 PM  Staffing Anesthesiologist: Mal Amabile, MD Performed: anesthesiologist   Preanesthetic Checklist Completed: patient identified, IV checked, site marked, risks and benefits discussed, surgical consent, monitors and equipment checked, pre-op evaluation and timeout performed  Epidural Patient position: sitting Prep: DuraPrep and site prepped and draped Patient monitoring: continuous pulse ox and blood pressure Approach: midline Location: L3-L4 Injection technique: LOR air  Needle:  Needle type: Tuohy  Needle gauge: 17 G Needle length: 9 cm and 9 Needle insertion depth: 4 cm Catheter type: closed end flexible Catheter size: 19 Gauge Catheter at skin depth: 9 cm Test dose: negative and Other  Assessment Events: blood not aspirated, injection not painful, no injection resistance, no paresthesia and negative IV test  Additional Notes Patient identified. Risks and benefits discussed including failed block, incomplete  Pain control, post dural puncture headache, nerve damage, paralysis, blood pressure Changes, nausea, vomiting, reactions to medications-both toxic and allergic and post Partum back pain. All questions were answered. Patient expressed understanding and wished to proceed. Sterile technique was used throughout procedure. Epidural site was Dressed with sterile barrier dressing. No paresthesias, signs of intravascular injection Or signs of intrathecal spread were encountered.  Patient was more comfortable after the epidural was dosed. Please see RN's note for documentation of vital signs and FHR which are stable. Reason for block:procedure for pain

## 2021-11-01 NOTE — Lactation Note (Signed)
This note was copied from a baby's chart. Lactation Consultation Note Experienced BF mom stated baby latching well w/o difficulty. Mom's oldest child is 38 yrs old and youngest is 2 yrs old. Mom BF her 2nd child the longest. Mom has no questions at this time. Will f/u mom on MBU.  Patient Name: Sheila [name] Date: 11/01/2021 Reason for consult: L&D Initial assessment;Term Age:20 hours  Maternal Data    Feeding    LATCH Score Latch: Grasps breast easily, tongue down, lips flanged, rhythmical sucking.  Audible Swallowing: None  Type of Nipple: Everted at rest and after stimulation  Comfort (Breast/Nipple): Soft / non-tender  Hold (Positioning): No assistance needed to correctly position infant at breast.  LATCH Score: 8   Lactation Tools Discussed/Used    Interventions    Discharge    Consult Status Consult Status: Follow-up from L&D Date: 11/02/21 Follow-up type: In-patient    Charyl Dancer 11/01/2021, 8:18 PM

## 2021-11-01 NOTE — Progress Notes (Signed)
Labor Progress Note Sheila [name] is a 36 y.o. [id number] at [redacted]w[redacted]d who presented for IOL due to postdates.   S: Doing well per RN. Requesting epidural.   O:  BP 123/74   Pulse 76   Temp (!) 97.2 F (36.2 C) (Oral)   Resp 16   Ht 5\' 6"  (1.676 m)   Wt 87.3 kg   LMP 02/03/2021   BMI 31.05 kg/m   EFM: Baseline 135 bpm, moderate variability, + accels, no decels  Toco: Every 1-5 minutes  CVE: Dilation: 5 Effacement (%): 70 Cervical Position: Posterior Station: -2 Presentation: Vertex Exam by:: [place], rn  A&P: 37 y.o. 30 [redacted]w[redacted]d   #Labor: Progressing well s/p Cytotec. Contraction pattern becoming more regular. Plan for AROM once comfortable with epidural. Anticipate SVD.  #Pain: Epidural requested; will notify anesthesia  #FWB: Cat 1  #GBS negative; receiving PCN due to hx of + GBS in prior pregnancy and culture from current pregnancy 5 weeks ago  [redacted]w[redacted]d, MD 6:06 PM
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
## 2021-11-01 NOTE — Discharge Summary (Addendum)
Postpartum Discharge Summary    Patient Name: Sheila [name] DOB: 04-12-1986 MRN: [id number]  Date of admission: 11/01/2021 Delivery date:11/01/2021  Delivering provider: Renee Harder  Date of discharge: 11/03/2021  Admitting diagnosis: Post-dates pregnancy [O48.0] Intrauterine pregnancy: [redacted]w[redacted]d    Secondary diagnosis:  Principal Problem:   SVD (spontaneous vaginal delivery) Active Problems:   AMA (advanced maternal age) multigravida 35+   Cystic fibrosis carrier   History of group B Streptococcus (GBS) infection   Supervision of other normal pregnancy, antepartum   Post-dates pregnancy  Additional problems: None    Discharge diagnosis: Term Pregnancy Delivered                                              Post partum procedures: None Augmentation: Cytotec Complications: None  Hospital course: Induction of Labor With Vaginal Delivery   36y.o. yo [phone] [id number] admitted to the hospital 11/01/2021 for induction of labor.  Indication for induction: Postdates.  Patient had an uncomplicated labor course as follows: Membrane Rupture Time/Date: 6:44 PM ,11/01/2021   Delivery Method:Vaginal, Spontaneous  Episiotomy: None  Lacerations:  1st degree  Details of delivery can be found in separate delivery note.  Patient had a routine postpartum course.  She is eating, drinking, voiding, and ambulating without issue.  Her pain and bleeding are controlled.  She is breastfeeding well.  Patient is discharged home 11/03/21.  Newborn Data: Birth date:11/01/2021  Birth time:7:05 PM  Gender:Female  Living status:Living  Apgars:9 ,9  Weight:3770 g   Magnesium Sulfate received: No BMZ received: No Rhophylac: N/A MMR : N/A - Immune  T-DaP: Given prenatally Flu: No Transfusion: No  Physical exam  Vitals:   11/02/21 0834 11/02/21 1605 11/02/21 2125 11/03/21 0538  BP: 127/84 106/64 123/84 100/62  Pulse: 76 81 84 78  Resp: _0 Temp: 98.5 F (36.9 C) 98.6  F (37 C) 99.2 F (37.3 C) 98.2 F (36.8 C)  TempSrc: Oral Axillary Oral Oral  SpO2: 100%  98% 98%  Weight:      Height:       General: alert, cooperative, and no distress Lochia: appropriate Uterine Fundus: firm and below umbilicus  DVT Evaluation: no LE edema or calf tenderness to palpation   Labs: Lab Results  Component Value Date   WBC 6.2 11/01/2021   HGB 10.5 (L) 11/01/2021   HCT 31.6 (L) 11/01/2021   MCV 86.3 11/01/2021   PLT 287 11/01/2021      Latest Ref Rng & Units 10/02/2021    8:47 AM  CMP  Glucose 70 - 99 mg/dL 98   BUN 6 - 20 mg/dL 6   Creatinine 0.44 - 1.00 mg/dL 0.30   Sodium 135 - 145 mmol/L 137   Potassium 3.5 - 5.1 mmol/L 3.5   Chloride 98 - 111 mmol/L 109    Edinburgh Score:     No data to display          After visit meds:  Allergies as of 11/03/2021   No Known Allergies      Medication List     STOP taking these medications    aspirin 81 MG chewable tablet Commonly known as: Aspirin Childrens   docusate sodium 100 MG capsule Commonly known as: Colace       TAKE these medications  acetaminophen 500 MG tablet Commonly known as: TYLENOL Take 2 tablets (1,000 mg total) by mouth every 8 (eight) hours as needed (pain). What changed:  how much to take when to take this reasons to take this   albuterol 108 (90 Base) MCG/ACT inhaler Commonly known as: VENTOLIN HFA Inhale 2 puffs into the lungs every 6 (six) hours as needed for wheezing or shortness of breath.   ibuprofen 600 MG tablet Commonly known as: ADVIL Take 1 tablet (600 mg total) by mouth every 6 (six) hours.   polyethylene glycol powder 17 GM/SCOOP powder Commonly known as: MiraLax Dissolve one scoop of powder in full glass of water or juice and take by mouth daily at bedtime.   Vitafol Ultra 29-0.6-0.4-200 MG Caps Take 1 capsule by mouth daily in the afternoon.        Discharge home in stable condition Infant Feeding: Breast Infant Disposition: home  with mother Discharge instruction: per After Visit Summary and Postpartum booklet. Activity: Advance as tolerated. Pelvic rest for 6 weeks.  Diet: routine diet Future Appointments: Future Appointments  Date Time Provider [place]  12/14/2021  9:15 AM Ndulue, Lyndel Safe, MD [facility] None   Follow up Visit: Message sent to [facility] by D. Simpson, CNM on 11/01/21 Please schedule this patient for a In person postpartum visit in 6 weeks with the following provider: Any provider. Additional Postpartum F/U: Postpartum Depression checkup  Low risk pregnancy complicated by:  AMA Delivery mode:  Vaginal, Spontaneous  Anticipated Birth Control:  OCPs  11/03/2021 Darci Current, DO  GME ATTESTATION:  I saw and evaluated the patient. I agree with the findings and the plan of care as documented in the resident's note. I have made changes to documentation as necessary.  Progressing well and meeting all postpartum milestones. VSS. Fundus firm. Pain and bleeding controlled. Breastfeeding well. Stable for discharge with postpartum follow up above. All questions and concerns addressed.   Vilma Meckel, MD OB Fellow, [place] [facility] 11/03/2021 9:00 AM

## 2021-11-01 NOTE — Anesthesia Preprocedure Evaluation (Signed)
Anesthesia Evaluation  Patient identified by MRN, date of birth, ID band Patient awake    Reviewed: Allergy & Precautions, Patient's Chart, lab work & pertinent test results  Airway Mallampati: II  TM Distance: >3 FB Neck ROM: Full    Dental no notable dental hx. (+) Teeth Intact   Pulmonary asthma ,    Pulmonary exam normal breath sounds clear to auscultation       Cardiovascular negative cardio ROS Normal cardiovascular exam Rhythm:Regular Rate:Normal     Neuro/Psych negative neurological ROS  negative psych ROS   GI/Hepatic Neg liver ROS, GERD  ,  Endo/Other  Obesity  Renal/GU negative Renal ROS  negative genitourinary   Musculoskeletal negative musculoskeletal ROS (+)   Abdominal (+) + obese,   Peds  Hematology  (+) Blood dyscrasia, anemia ,   Anesthesia Other Findings   Reproductive/Obstetrics (+) Pregnancy                             Anesthesia Physical Anesthesia Plan  ASA: 2  Anesthesia Plan: Epidural   Post-op Pain Management:    Induction:   PONV Risk Score and Plan:   Airway Management Planned: Natural Airway  Additional Equipment:   Intra-op Plan:   Post-operative Plan:   Informed Consent: I have reviewed the patients History and Physical, chart, labs and discussed the procedure including the risks, benefits and alternatives for the proposed anesthesia with the patient or authorized representative who has indicated his/her understanding and acceptance.       Plan Discussed with: Anesthesiologist  Anesthesia Plan Comments:         Anesthesia Quick Evaluation

## 2021-11-01 NOTE — Progress Notes (Signed)
Labor Progress Note Sheila [name] is a 36 y.o. [id number] at [redacted]w[redacted]d who presented for IOL due to postdates.   S: Doing well. No concerns per RN.   O:  BP 132/72   Pulse 84   Temp 98 F (36.7 C) (Oral)   Resp 16   Ht 5\' 6"  (1.676 m)   Wt 87.3 kg   LMP 02/03/2021   BMI 31.05 kg/m   EFM: Baseline 140 bpm, moderate variability, + accels, no decels  Toco: Irregular contractions   CVE: Dilation: 3 Effacement (%): 60 Cervical Position: Posterior Station: -3 Presentation: Vertex Exam by:: [email or web address], RN  A&P: 35 y.o. Sheila [name] [redacted]w[redacted]d   #Labor: Progressing well. Additional dose of Cytotec given. Will reassess in 4 hours.  #Pain: PRN #FWB: Cat 1  #GBS negative; receiving PCN due to hx of + GBS in prior pregnancy and culture from current pregnancy 5 weeks ago  [redacted]w[redacted]d, MD 2:27 PM

## 2021-11-02 NOTE — Progress Notes (Addendum)
Post Partum Day 1 Subjective: no complaints, up ad lib, voiding, tolerating PO, and + flatus  Objective: Blood pressure 123/69, pulse 75, temperature 98.4 F (36.9 C), temperature source Oral, resp. rate 18, height 5\' 6"  (1.676 m), weight 87.3 kg, last menstrual period 02/03/2021, SpO2 100 %, unknown if currently breastfeeding.  Physical Exam:  General: alert and cooperative Lochia: appropriate Uterine Fundus: firm DVT Evaluation: No evidence of DVT seen on physical exam. Negative Homan's sign. No cords or calf tenderness.  Recent Labs    11/01/21 0815  HGB 10.5*  HCT 31.6*    Assessment/Plan: Plan for discharge tomorrow, Breastfeeding, and Contraception OCPs.   LOS: 1 day   11/03/21, DO 11/02/2021, 8:35 AM    Attestation of CNM Supervision of Resident: Evaluation and management procedures were performed by the [facility] Medicine Resident under my supervision. I was immediately available for direct supervision, assistance and direction throughout this encounter.  I also confirm that I have verified the information documented in the resident's note, and that I have also personally reperformed the pertinent components of the physical exam and all of the medical decision making activities.  I have also made any necessary editorial changes.  [facility], CNM 11/02/2021 9:45 AM

## 2021-11-02 NOTE — Anesthesia Postprocedure Evaluation (Signed)
Anesthesia Post Note  Patient: Sheila [name]  Procedure(s) Performed: AN AD HOC LABOR EPIDURAL     Patient location during evaluation: Mother Baby Anesthesia Type: Epidural Level of consciousness: awake and alert Pain management: pain level controlled Vital Signs Assessment: post-procedure vital signs reviewed and stable Respiratory status: spontaneous breathing, nonlabored ventilation and respiratory function stable Cardiovascular status: stable Postop Assessment: no headache, no backache, epidural receding, no apparent nausea or vomiting, patient able to bend at knees, adequate PO intake and able to ambulate Anesthetic complications: no   No notable events documented.  Last Vitals:  Vitals:   11/02/21 0523 11/02/21 0834  BP: 123/69 127/84  Pulse: 75 76  Resp: 18 18  Temp: 36.9 C 36.9 C  SpO2: 100% 100%    Last Pain:  Vitals:   11/02/21 0834  TempSrc: Oral  PainSc: 3    Pain Goal:                   Laban Emperor

## 2021-11-02 NOTE — Lactation Note (Signed)
This note was copied from a baby's chart. Lactation Consultation Note  Patient Name: Sheila [name] Date: 11/02/2021 Reason for consult: Initial assessment Age:38 hours  P4, Ex BF.  Mother hand expressed drops then latched baby in cradle hold.  Baby was off and on breast. Feed on demand with cues.  Goal 8-12+ times per day after first 24 hrs.  Place baby STS if not cueing.  Mom made aware of O/P services, breastfeeding support groups, and our phone # for post-discharge questions.  Suggest mother call for help as needed.    Maternal Data Has patient been taught Hand Expression?: Yes Does the patient have breastfeeding experience prior to this delivery?: Yes How long did the patient breastfeed?: 15 mos, 18 mos. 19 mos.  Feeding Mother's Current Feeding Choice: Breast Milk  LATCH Score Latch: Grasps breast easily, tongue down, lips flanged, rhythmical sucking.  Audible Swallowing: A few with stimulation  Type of Nipple: Everted at rest and after stimulation  Comfort (Breast/Nipple): Soft / non-tender  Hold (Positioning): Assistance needed to correctly position infant at breast and maintain latch.  LATCH Score: 8   Lactation Tools Discussed/Used    Interventions Interventions: Breast feeding basics reviewed;Assisted with latch;Skin to skin;Hand express;Education;LC Services brochure  Discharge    Consult Status Consult Status: Follow-up Date: 11/03/21 Follow-up type: In-patient    Sheila [name] [facility] 11/02/2021, 9:20 AM

## 2021-11-03 MED ORDER — IBUPROFEN 600 MG PO TABS: 600.0000 mg | ORAL_TABLET | Freq: Four times a day (QID) | ORAL | 0 refills | Status: AC

## 2021-11-03 MED ORDER — ACETAMINOPHEN 500 MG PO TABS: 1000.0000 mg | ORAL_TABLET | Freq: Three times a day (TID) | ORAL | 0 refills | Status: AC | PRN

## 2021-11-03 NOTE — Progress Notes (Signed)
CSW met with MOB at bedside to address consult placed for food insecurity. When CSW entered room, FOB was present. MOB provided verbal consent to speak in front of FOB about anything. CSW inquired about food resource needs. MOB reports there are times when she is in need of additional assistance for food. MOB reports she receives [facility] and [employer]. CSW provided MOB with a [employer] bank schedule. CSW also encouraged MOB to notify [facility] and MOB's DHHS caseworker of infant's birth to have infant added to benefits. MOB inquired about using Food Stamps benefits to purchase formula. CSW explained that MOB should receive WIC benefits to purchase formula and can also use Food Stamps balance. CSW assessed for additional resource needs. MOB thanked CSW for food resource information and declined additional needs at this time.   Signed,  Berniece Salines, MSW, LCSWA, LCASA 11/03/2021 2:25 PM

## 2021-11-10 ENCOUNTER — Telehealth (HOSPITAL_COMMUNITY): Payer: Self-pay

## 2021-11-10 NOTE — Telephone Encounter (Signed)
Patient reports feeling good. Patient declines questions/concerns about her health and healing.  Patient reports that baby is doing well. Eating, peeing/pooping, and gaining weight well. Baby sleeps in a Crib. RN reviewed ABC's of safe sleep with patient. Patient declines any questions or concerns about baby.  EPDS score is 3.  Marcelino Duster [facility]  11/10/21,1147

## 2021-11-12 ENCOUNTER — Telehealth: Payer: Self-pay

## 2021-11-12 NOTE — Telephone Encounter (Signed)
Written order for Breast Pump signed and faxed to [facility] 11/12/21

## 2021-12-14 ENCOUNTER — Ambulatory Visit (INDEPENDENT_AMBULATORY_CARE_PROVIDER_SITE_OTHER): Payer: Medicaid Other | Admitting: Family Medicine

## 2021-12-14 ENCOUNTER — Encounter: Payer: Self-pay | Admitting: Family Medicine

## 2021-12-14 DIAGNOSIS — Z30011 Encounter for initial prescription of contraceptive pills: Secondary | ICD-10-CM | POA: Diagnosis not present

## 2021-12-14 DIAGNOSIS — D508 Other iron deficiency anemias: Secondary | ICD-10-CM | POA: Diagnosis not present

## 2021-12-14 DIAGNOSIS — M79652 Pain in left thigh: Secondary | ICD-10-CM

## 2021-12-14 DIAGNOSIS — M79651 Pain in right thigh: Secondary | ICD-10-CM | POA: Diagnosis not present

## 2021-12-14 DIAGNOSIS — R87610 Atypical squamous cells of undetermined significance on cytologic smear of cervix (ASC-US): Secondary | ICD-10-CM

## 2021-12-14 LAB — POCT URINE PREGNANCY: Preg Test, Ur: NEGATIVE

## 2021-12-14 MED ORDER — FERROUS SULFATE 325 (65 FE) MG PO TABS: 325.0000 mg | ORAL_TABLET | ORAL | 3 refills | Status: AC

## 2021-12-14 MED ORDER — NORGESTIMATE-ETH ESTRADIOL 0.25-35 MG-MCG PO TABS: 1.0000 | ORAL_TABLET | Freq: Every day | ORAL | 11 refills | Status: AC

## 2021-12-14 NOTE — Progress Notes (Signed)
Post Partum Visit Note  Sheila [name] is a 36 y.o. [id number] female who presents for a postpartum visit. She is 6 weeks postpartum following a normal spontaneous vaginal delivery.  I have fully reviewed the prenatal and intrapartum course. The delivery was at 41.1 gestational weeks.  Anesthesia: epidural. Postpartum course has been unremarkable. Baby is doing well. Baby is feeding by breast. Bleeding no bleeding. Bowel function is normal. Bladder function is normal. Patient is not sexually active. Contraception method is none. Postpartum depression screening: negative.  Describes pain in lateral aspects of both thigh, dull ache, intermittent. Sometime worse at night, but also present during the day. She has 4 kids at home, so busy with them, but does not do any routine exercise otherwise. Improves with NSAID or tylenol.   Upstream - 12/14/21 0926       Pregnancy Intention Screening   Does the patient want to become pregnant in the next year? No    Does the patient's partner want to become pregnant in the next year? No    Would the patient like to discuss contraceptive options today? No      Contraception Wrap Up   Current Method Oral Contraceptive;Abstinence            The pregnancy intention screening data noted above was reviewed. Potential methods of contraception were discussed. The patient elected to proceed with No data recorded.   Edinburgh Postnatal Depression Scale - 12/14/21 0927       Edinburgh Postnatal Depression Scale:  In the Past 7 Days   I have been able to laugh and see the funny side of things. 0    I have looked forward with enjoyment to things. 0    I have blamed myself unnecessarily when things went wrong. 0    I have been anxious or worried for no good reason. 0    I have felt scared or panicky for no good reason. 0    Things have been getting on top of me. 0    I have been so unhappy that I have had difficulty sleeping. 0    I have felt sad  or miserable. 0    I have been so unhappy that I have been crying. 0    The thought of harming myself has occurred to me. 0    Edinburgh Postnatal Depression Scale Total 0             Health Maintenance Due  Topic Date Due   COVID-19 Vaccine (1) Never done   INFLUENZA VACCINE  11/20/2021    The following portions of the patient's history were reviewed and updated as appropriate: allergies, current medications, past family history, past medical history, past social history, past surgical history, and problem list.  Review of Systems Pertinent items are noted in HPI.  Objective:  BP 115/73   Pulse 70   Ht 5\' 6"  (1.676 m)   Wt 174 lb (78.9 kg)   LMP 02/03/2021   Breastfeeding Yes   BMI 28.08 kg/m    General:  alert, cooperative, and appears stated age   Breasts:  not indicated  Lungs: No respiratory distres  Heart:  regular rate and rhythm  Abdomen: Soft, non -tender    Wound N/a  GU exam:  not indicated       Assessment:   1. Encounter for initial prescription of contraceptive pills Negative UPT. Rx for combined OCPs sent. Discussed taking the same time everyday.  Has taken  these before. - POCT urine pregnancy  2. Routine postpartum follow-up normal postpartum exam.  Doing well overall. No concerns noted today. Baby is growing well. She is breast feeding.  3. Other iron deficiency anemia Noted to be anemic in pregnancy, possibly contributing to her symptoms.  - restart PO iron, eat iron rich foos.  4. Bilateral thigh pain Etiology unclear, possibly due to minimal exercise, Fe deficiency.  Not tender to palpation. No obvious anomalies. Recommend topical analgesia, stretching exercises, daily walks. Restarting Fe supplement. Follow up with PCP if no improvement over the next 6 weeks. May benefit from formal physical therapy.  5. ASCUS with negative HPV  Plan:   Essential components of care per ACOG recommendations:  1.  Mood and well being: Patient with  negative depression screening today. Reviewed local resources for support.  - Patient tobacco use? No.   - hx of drug use? No.    2. Infant care and feeding:  -Patient currently breastmilk feeding? Yes. Discussed returning to work and pumping.  -Social determinants of health (SDOH) reviewed in EPIC. No concerns  3. Sexuality, contraception and birth spacing - Patient does not want a pregnancy in the next year.  Desired family size is 5 children.  - Reviewed reproductive life planning. Reviewed contraceptive methods based on pt preferences and effectiveness.  Patient desired Oral Contraceptive today.   - Discussed birth spacing of 18 months  4. Sleep and fatigue -Encouraged family/partner/community support of 4 hrs of uninterrupted sleep to help with mood and fatigue  5. Physical Recovery  - Discussed patients delivery and complications. She describes her labor as good. - Patient had a Vaginal, no problems at delivery. Patient had a  no  laceration. P- Patient has urinary incontinence? No. - Patient is safe to resume physical and sexual activity  6.  Health Maintenance - HM due items addressed Yes - Last pap smear  Diagnosis  Date Value Ref Range Status  06/20/2021 (A)  Final   - Atypical squamous cells of undetermined significance (ASC-US)   Pap smear not done at today's visit. - needs to be repeated in 3 years. Discussed importance of this. -Breast Cancer screening indicated? No.   7. Chronic Disease/Pregnancy Condition follow up: Anemia and Abnormal pap smear - restart Fe PO every other day. Rx sent. - Discussed importance of repeat pap smear in 3 years.  - PCP follow up - for thigh pain, if no improvement over the next 6 weeks.   [facility] [employer], [facility]

## 2021-12-14 NOTE — Patient Instructions (Signed)
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 - Try some stretching exercises - you can apply aspercreme or icyhot to the area.   2. Follow up in 3 year for a repeat pap smear

## 2022-03-26 ENCOUNTER — Emergency Department (HOSPITAL_COMMUNITY)
Admission: EM | Admit: 2022-03-26 | Discharge: 2022-03-26 | Discharge status: Against Medical Advice | Payer: Medicaid Other | Attending: Emergency Medicine | Admitting: Emergency Medicine

## 2022-03-26 ENCOUNTER — Emergency Department (HOSPITAL_COMMUNITY): Payer: Medicaid Other

## 2022-03-26 ENCOUNTER — Encounter (HOSPITAL_COMMUNITY): Payer: Self-pay | Admitting: Emergency Medicine

## 2022-03-26 DIAGNOSIS — Z5321 Procedure and treatment not carried out due to patient leaving prior to being seen by health care provider: Secondary | ICD-10-CM | POA: Insufficient documentation

## 2022-03-26 DIAGNOSIS — M25531 Pain in right wrist: Secondary | ICD-10-CM | POA: Diagnosis not present

## 2022-03-26 DIAGNOSIS — M79641 Pain in right hand: Secondary | ICD-10-CM | POA: Insufficient documentation

## 2022-03-26 NOTE — ED Triage Notes (Signed)
Pt reports right hand and wrist pain for 2 years. Reports surgery there 9 years ago.

## 2022-03-26 NOTE — ED Provider Triage Note (Signed)
Emergency Medicine Provider Triage Evaluation Note  Cordia Miklos , a 36 y.o. female  was evaluated in triage.  Pt complains of R hand and wrist pain x 2 years. States had surgery in [place] for fracture ten years ago. Thought it healed. Pain every day, taking tylenol, getting worse.  Review of Systems  Positive: Wrist pain Negative: Fever, weakness, numbness, tingling  Physical Exam  BP 112/78   Pulse 68   Temp 98.3 F (36.8 C)   Resp 14   SpO2 99%  Gen:   Awake, no distress   Resp:  Normal effort  MSK:   Moves extremities without difficulty  Other:  Well healed surgical incision. Intact radial pulse and cardinal hand movements  Medical Decision Making  Medically screening exam initiated at 4:51 PM.  Appropriate orders placed.  Silvanna Mounkaila-Diadieri was informed that the remainder of the evaluation will be completed by another provider, this initial triage assessment does not replace that evaluation, and the importance of remaining in the ED until their evaluation is complete.  Alfonzo Beers, MD 03/26/22 [phone]

## 2022-03-26 NOTE — ED Notes (Signed)
Registration states this patient left at 2103
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
